# Patient Record
Sex: Female | Born: 1945 | Race: White | Hispanic: No
Health system: Southern US, Community
[De-identification: ages and names within clinical notes are randomized; demographics above are authoritative.]

## PROBLEM LIST (undated history)

## (undated) DIAGNOSIS — C9590 Leukemia, unspecified not having achieved remission: Secondary | ICD-10-CM

---

## 2005-11-29 ENCOUNTER — Encounter: Payer: Self-pay | Admitting: Internal Medicine

## 2005-12-24 ENCOUNTER — Ambulatory Visit (HOSPITAL_COMMUNITY): Admission: RE | Admit: 2005-12-24 | Discharge: 2005-12-24 | Payer: Self-pay | Admitting: Interventional Radiology

## 2007-03-31 IMAGING — CR DG CHEST 2V
2 series · 2 of 2 positions shown · non-contrast
Comparison: None

CLINICAL DATA: Cerebral artery stenosis, precatheterization. Chronic bronchitis,
COPD, heart murmur

CHEST - 2 VIEW:

[view not recorded (1 of 2)]
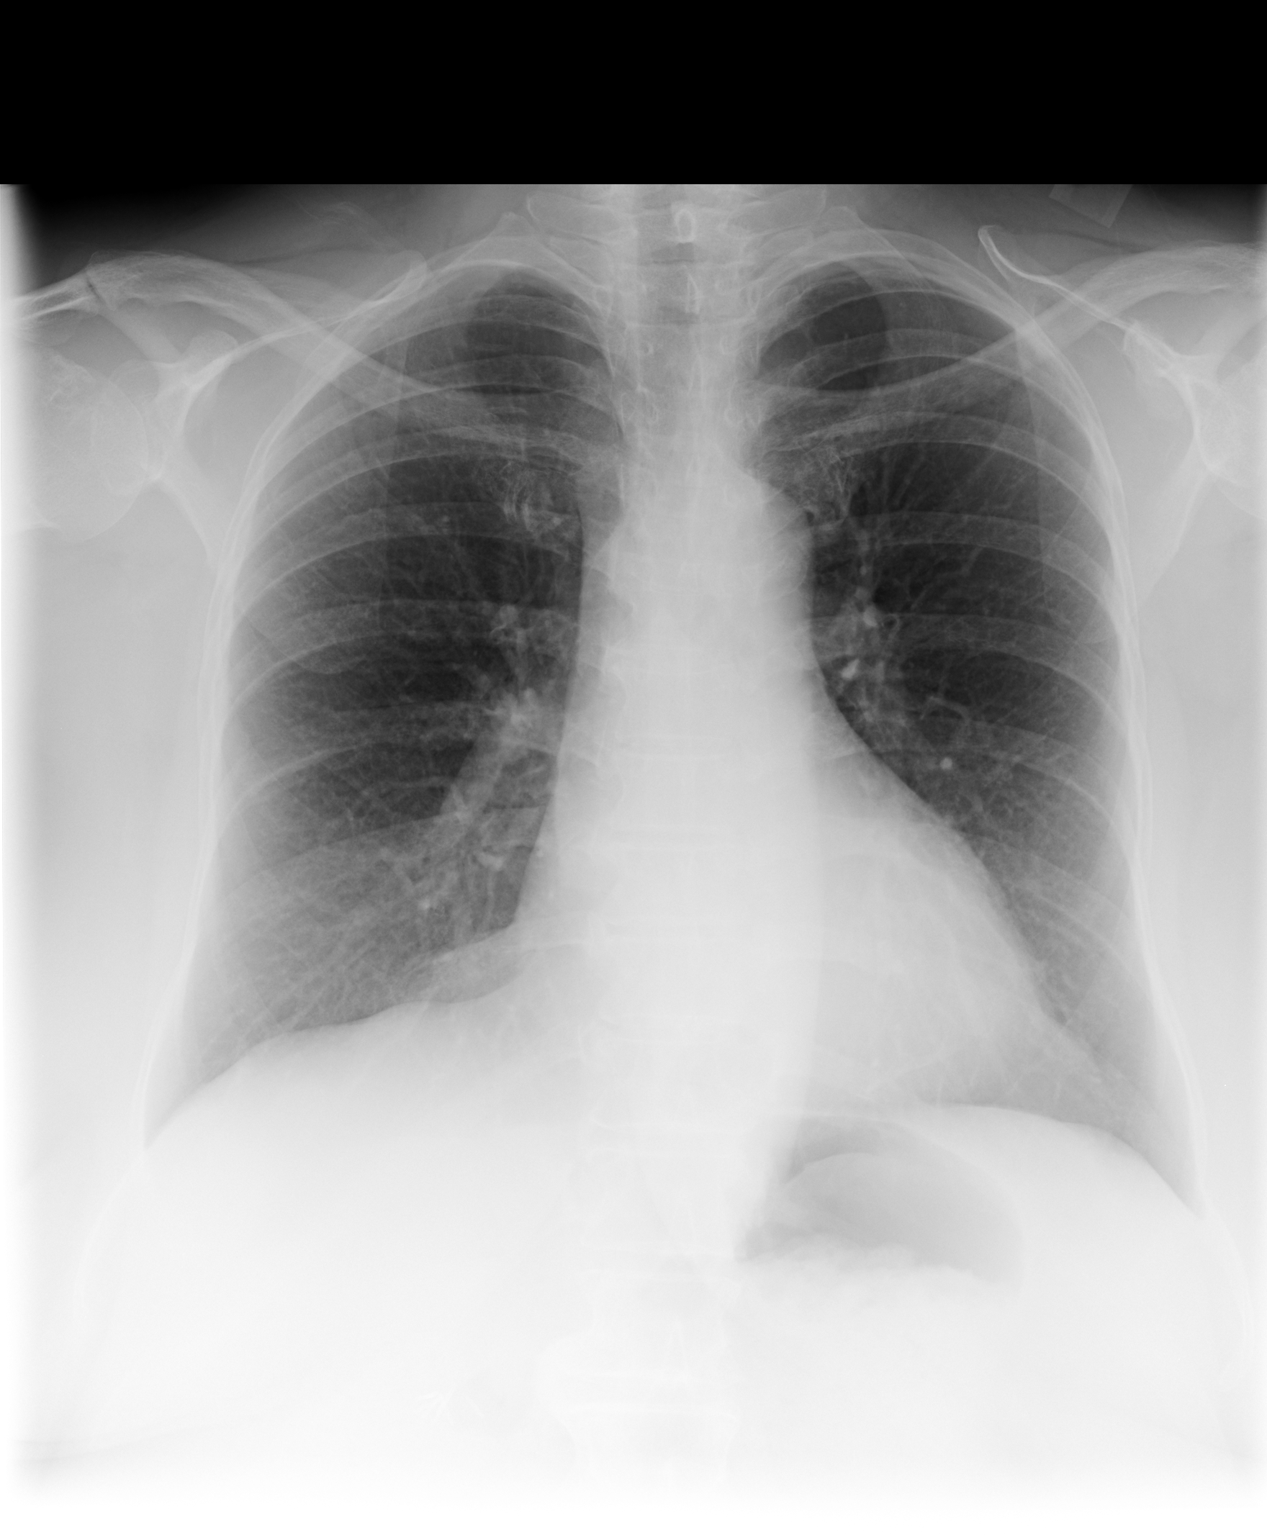

[view not recorded (2 of 2)]
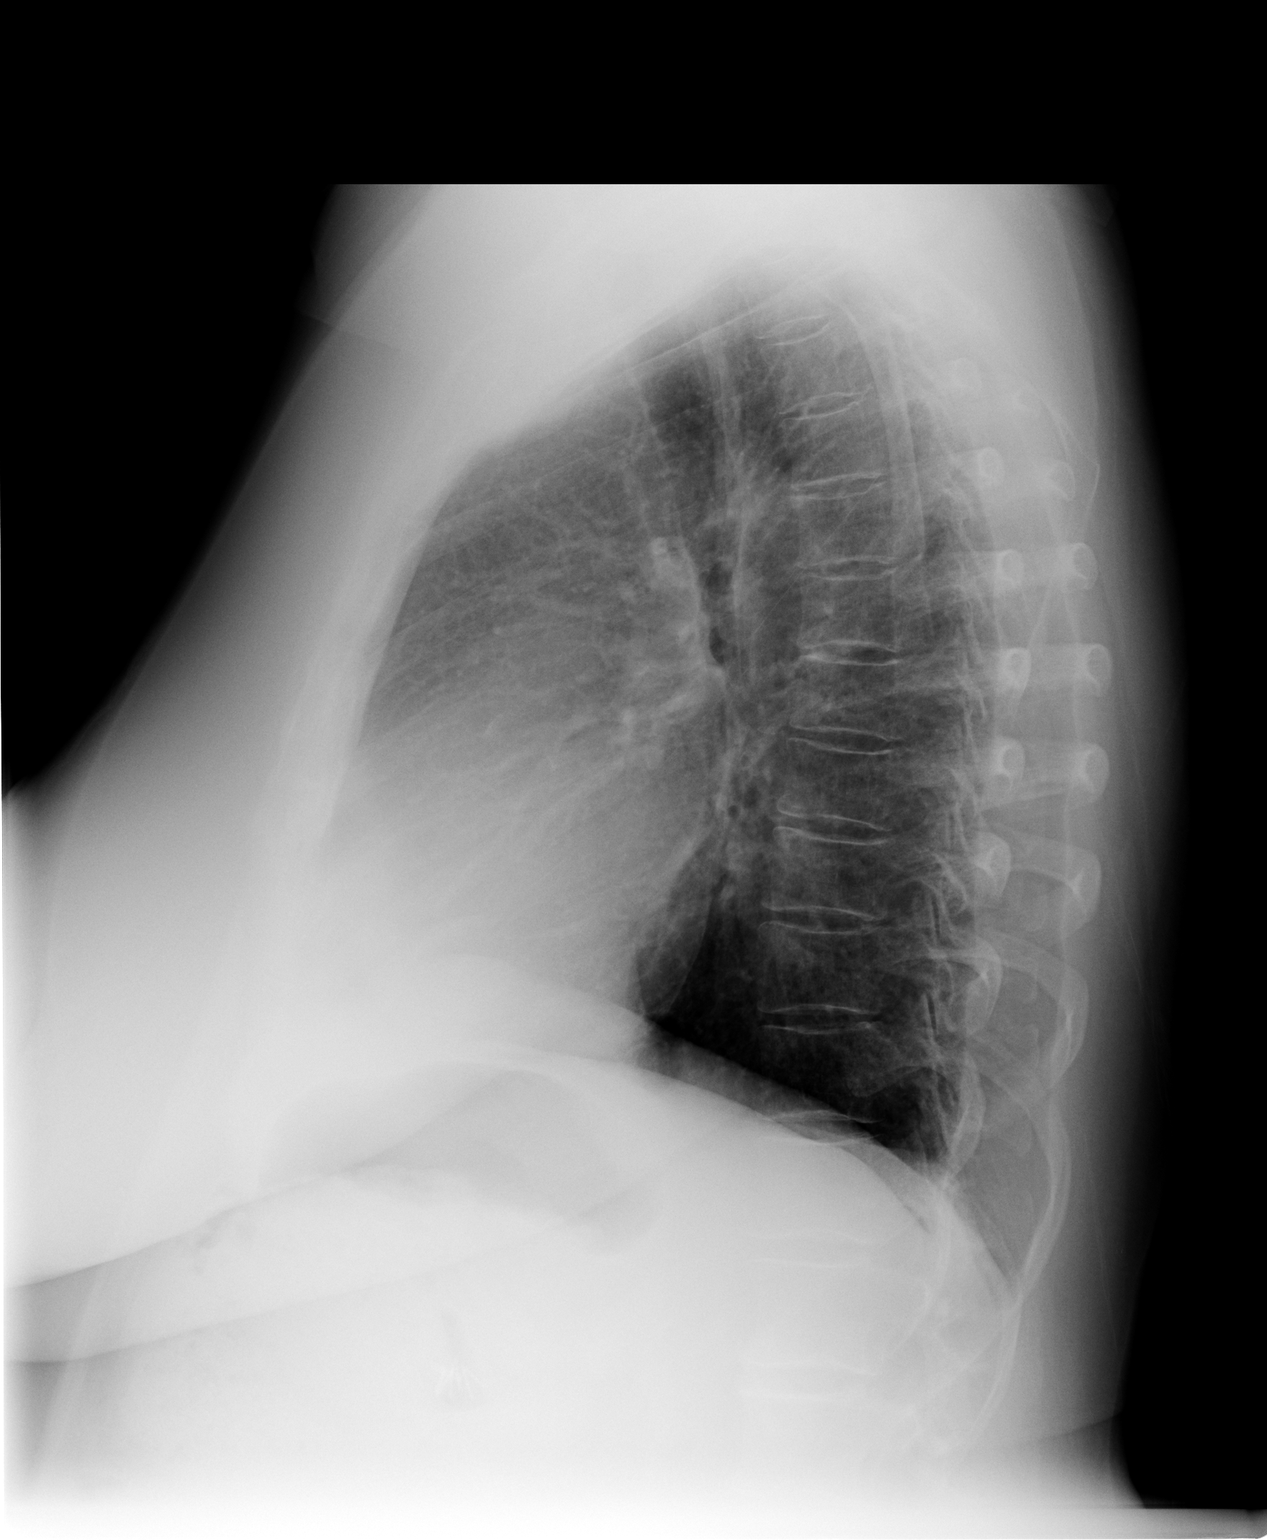

[2 of 2 positions shown; findings below may reference images not displayed]

FINDINGS: Heart is borderline in size. There is mild peribronchial thickening.
No focal opacities or effusions. Visualized skeleton unremarkable.
IMPRESSION: Borderline heart size.

Mild chronic bronchitis.

## 2011-05-03 DIAGNOSIS — E119 Type 2 diabetes mellitus without complications: Secondary | ICD-10-CM | POA: Diagnosis not present

## 2011-05-03 DIAGNOSIS — D72820 Lymphocytosis (symptomatic): Secondary | ICD-10-CM | POA: Diagnosis not present

## 2011-05-03 DIAGNOSIS — J42 Unspecified chronic bronchitis: Secondary | ICD-10-CM | POA: Diagnosis not present

## 2011-05-03 DIAGNOSIS — D801 Nonfamilial hypogammaglobulinemia: Secondary | ICD-10-CM | POA: Diagnosis not present

## 2011-05-16 DIAGNOSIS — H04559 Acquired stenosis of unspecified nasolacrimal duct: Secondary | ICD-10-CM | POA: Diagnosis not present

## 2011-05-27 DIAGNOSIS — E119 Type 2 diabetes mellitus without complications: Secondary | ICD-10-CM | POA: Diagnosis not present

## 2011-05-27 DIAGNOSIS — Z888 Allergy status to other drugs, medicaments and biological substances status: Secondary | ICD-10-CM | POA: Diagnosis not present

## 2011-05-27 DIAGNOSIS — Z882 Allergy status to sulfonamides status: Secondary | ICD-10-CM | POA: Diagnosis not present

## 2011-05-27 DIAGNOSIS — Z881 Allergy status to other antibiotic agents status: Secondary | ICD-10-CM | POA: Diagnosis not present

## 2011-05-27 DIAGNOSIS — K219 Gastro-esophageal reflux disease without esophagitis: Secondary | ICD-10-CM | POA: Diagnosis not present

## 2011-05-27 DIAGNOSIS — M5030 Other cervical disc degeneration, unspecified cervical region: Secondary | ICD-10-CM | POA: Diagnosis not present

## 2011-05-27 DIAGNOSIS — E785 Hyperlipidemia, unspecified: Secondary | ICD-10-CM | POA: Diagnosis not present

## 2011-05-27 DIAGNOSIS — D801 Nonfamilial hypogammaglobulinemia: Secondary | ICD-10-CM | POA: Diagnosis not present

## 2011-05-27 DIAGNOSIS — D72820 Lymphocytosis (symptomatic): Secondary | ICD-10-CM | POA: Diagnosis not present

## 2011-05-27 DIAGNOSIS — J069 Acute upper respiratory infection, unspecified: Secondary | ICD-10-CM | POA: Diagnosis not present

## 2011-05-27 DIAGNOSIS — Z8673 Personal history of transient ischemic attack (TIA), and cerebral infarction without residual deficits: Secondary | ICD-10-CM | POA: Diagnosis not present

## 2011-05-27 DIAGNOSIS — H04559 Acquired stenosis of unspecified nasolacrimal duct: Secondary | ICD-10-CM | POA: Diagnosis not present

## 2011-05-27 DIAGNOSIS — E041 Nontoxic single thyroid nodule: Secondary | ICD-10-CM | POA: Diagnosis not present

## 2011-05-27 DIAGNOSIS — Z79899 Other long term (current) drug therapy: Secondary | ICD-10-CM | POA: Diagnosis not present

## 2011-05-27 DIAGNOSIS — L930 Discoid lupus erythematosus: Secondary | ICD-10-CM | POA: Diagnosis not present

## 2011-06-05 DIAGNOSIS — H04309 Unspecified dacryocystitis of unspecified lacrimal passage: Secondary | ICD-10-CM | POA: Diagnosis not present

## 2011-06-17 DIAGNOSIS — Z6832 Body mass index (BMI) 32.0-32.9, adult: Secondary | ICD-10-CM | POA: Diagnosis not present

## 2011-06-17 DIAGNOSIS — J209 Acute bronchitis, unspecified: Secondary | ICD-10-CM | POA: Diagnosis not present

## 2011-06-17 DIAGNOSIS — F411 Generalized anxiety disorder: Secondary | ICD-10-CM | POA: Diagnosis not present

## 2011-06-21 DIAGNOSIS — R319 Hematuria, unspecified: Secondary | ICD-10-CM | POA: Diagnosis not present

## 2011-06-21 DIAGNOSIS — R3989 Other symptoms and signs involving the genitourinary system: Secondary | ICD-10-CM | POA: Diagnosis not present

## 2011-06-25 DIAGNOSIS — F411 Generalized anxiety disorder: Secondary | ICD-10-CM | POA: Diagnosis not present

## 2011-06-25 DIAGNOSIS — Z6831 Body mass index (BMI) 31.0-31.9, adult: Secondary | ICD-10-CM | POA: Diagnosis not present

## 2011-06-25 DIAGNOSIS — Z888 Allergy status to other drugs, medicaments and biological substances status: Secondary | ICD-10-CM | POA: Diagnosis not present

## 2011-06-25 DIAGNOSIS — R319 Hematuria, unspecified: Secondary | ICD-10-CM | POA: Diagnosis not present

## 2011-06-28 DIAGNOSIS — R319 Hematuria, unspecified: Secondary | ICD-10-CM | POA: Diagnosis not present

## 2011-06-28 DIAGNOSIS — R3989 Other symptoms and signs involving the genitourinary system: Secondary | ICD-10-CM | POA: Diagnosis not present

## 2011-06-29 DIAGNOSIS — E119 Type 2 diabetes mellitus without complications: Secondary | ICD-10-CM | POA: Diagnosis not present

## 2011-06-29 DIAGNOSIS — E7800 Pure hypercholesterolemia, unspecified: Secondary | ICD-10-CM | POA: Diagnosis not present

## 2011-06-29 DIAGNOSIS — L299 Pruritus, unspecified: Secondary | ICD-10-CM | POA: Diagnosis not present

## 2011-06-29 DIAGNOSIS — T7840XA Allergy, unspecified, initial encounter: Secondary | ICD-10-CM | POA: Diagnosis not present

## 2011-06-29 DIAGNOSIS — R21 Rash and other nonspecific skin eruption: Secondary | ICD-10-CM | POA: Diagnosis not present

## 2011-06-29 DIAGNOSIS — Z8614 Personal history of Methicillin resistant Staphylococcus aureus infection: Secondary | ICD-10-CM | POA: Diagnosis not present

## 2011-07-14 DIAGNOSIS — M549 Dorsalgia, unspecified: Secondary | ICD-10-CM | POA: Diagnosis not present

## 2011-07-14 DIAGNOSIS — R350 Frequency of micturition: Secondary | ICD-10-CM | POA: Diagnosis not present

## 2011-07-14 DIAGNOSIS — R3915 Urgency of urination: Secondary | ICD-10-CM | POA: Diagnosis not present

## 2011-07-14 DIAGNOSIS — R319 Hematuria, unspecified: Secondary | ICD-10-CM | POA: Diagnosis not present

## 2011-07-14 DIAGNOSIS — R109 Unspecified abdominal pain: Secondary | ICD-10-CM | POA: Diagnosis not present

## 2011-07-18 DIAGNOSIS — N23 Unspecified renal colic: Secondary | ICD-10-CM | POA: Diagnosis not present

## 2011-07-18 DIAGNOSIS — Z6831 Body mass index (BMI) 31.0-31.9, adult: Secondary | ICD-10-CM | POA: Diagnosis not present

## 2011-07-22 DIAGNOSIS — H179 Unspecified corneal scar and opacity: Secondary | ICD-10-CM | POA: Diagnosis not present

## 2011-07-22 DIAGNOSIS — H01029 Squamous blepharitis unspecified eye, unspecified eyelid: Secondary | ICD-10-CM | POA: Diagnosis not present

## 2011-07-26 DIAGNOSIS — D72820 Lymphocytosis (symptomatic): Secondary | ICD-10-CM | POA: Diagnosis not present

## 2011-07-26 DIAGNOSIS — D801 Nonfamilial hypogammaglobulinemia: Secondary | ICD-10-CM | POA: Diagnosis not present

## 2011-08-05 DIAGNOSIS — S8010XA Contusion of unspecified lower leg, initial encounter: Secondary | ICD-10-CM | POA: Diagnosis not present

## 2011-08-08 DIAGNOSIS — S8010XA Contusion of unspecified lower leg, initial encounter: Secondary | ICD-10-CM | POA: Diagnosis not present

## 2011-08-09 DIAGNOSIS — R296 Repeated falls: Secondary | ICD-10-CM | POA: Diagnosis not present

## 2011-08-09 DIAGNOSIS — S8010XA Contusion of unspecified lower leg, initial encounter: Secondary | ICD-10-CM | POA: Diagnosis not present

## 2011-08-09 DIAGNOSIS — S81809A Unspecified open wound, unspecified lower leg, initial encounter: Secondary | ICD-10-CM | POA: Diagnosis not present

## 2011-08-09 DIAGNOSIS — E119 Type 2 diabetes mellitus without complications: Secondary | ICD-10-CM | POA: Diagnosis not present

## 2011-08-09 DIAGNOSIS — S81009A Unspecified open wound, unspecified knee, initial encounter: Secondary | ICD-10-CM | POA: Diagnosis not present

## 2011-08-13 DIAGNOSIS — T148XXA Other injury of unspecified body region, initial encounter: Secondary | ICD-10-CM | POA: Diagnosis not present

## 2011-08-13 DIAGNOSIS — Z6830 Body mass index (BMI) 30.0-30.9, adult: Secondary | ICD-10-CM | POA: Diagnosis not present

## 2011-08-13 DIAGNOSIS — S8010XA Contusion of unspecified lower leg, initial encounter: Secondary | ICD-10-CM | POA: Diagnosis not present

## 2011-08-22 DIAGNOSIS — M79609 Pain in unspecified limb: Secondary | ICD-10-CM | POA: Diagnosis not present

## 2011-08-22 DIAGNOSIS — T148XXA Other injury of unspecified body region, initial encounter: Secondary | ICD-10-CM | POA: Diagnosis not present

## 2011-08-22 DIAGNOSIS — S82209A Unspecified fracture of shaft of unspecified tibia, initial encounter for closed fracture: Secondary | ICD-10-CM | POA: Diagnosis not present

## 2011-08-22 DIAGNOSIS — S8010XA Contusion of unspecified lower leg, initial encounter: Secondary | ICD-10-CM | POA: Diagnosis not present

## 2011-08-22 DIAGNOSIS — Z6830 Body mass index (BMI) 30.0-30.9, adult: Secondary | ICD-10-CM | POA: Diagnosis not present

## 2011-08-29 DIAGNOSIS — R109 Unspecified abdominal pain: Secondary | ICD-10-CM | POA: Diagnosis not present

## 2011-09-03 DIAGNOSIS — E119 Type 2 diabetes mellitus without complications: Secondary | ICD-10-CM | POA: Diagnosis not present

## 2011-09-03 DIAGNOSIS — R1013 Epigastric pain: Secondary | ICD-10-CM | POA: Diagnosis not present

## 2011-09-03 DIAGNOSIS — R070 Pain in throat: Secondary | ICD-10-CM | POA: Diagnosis not present

## 2011-09-03 DIAGNOSIS — Z8719 Personal history of other diseases of the digestive system: Secondary | ICD-10-CM | POA: Diagnosis not present

## 2011-09-03 DIAGNOSIS — R079 Chest pain, unspecified: Secondary | ICD-10-CM | POA: Diagnosis not present

## 2011-09-03 DIAGNOSIS — Z8673 Personal history of transient ischemic attack (TIA), and cerebral infarction without residual deficits: Secondary | ICD-10-CM | POA: Diagnosis not present

## 2011-09-04 DIAGNOSIS — R160 Hepatomegaly, not elsewhere classified: Secondary | ICD-10-CM | POA: Diagnosis not present

## 2011-09-27 DIAGNOSIS — L03039 Cellulitis of unspecified toe: Secondary | ICD-10-CM | POA: Diagnosis not present

## 2011-10-04 DIAGNOSIS — S43429A Sprain of unspecified rotator cuff capsule, initial encounter: Secondary | ICD-10-CM | POA: Diagnosis not present

## 2011-10-04 DIAGNOSIS — M25519 Pain in unspecified shoulder: Secondary | ICD-10-CM | POA: Diagnosis not present

## 2011-10-04 DIAGNOSIS — M66329 Spontaneous rupture of flexor tendons, unspecified upper arm: Secondary | ICD-10-CM | POA: Diagnosis not present

## 2011-12-17 DIAGNOSIS — T63461A Toxic effect of venom of wasps, accidental (unintentional), initial encounter: Secondary | ICD-10-CM | POA: Diagnosis not present

## 2011-12-17 DIAGNOSIS — T6391XA Toxic effect of contact with unspecified venomous animal, accidental (unintentional), initial encounter: Secondary | ICD-10-CM | POA: Diagnosis not present

## 2011-12-18 DIAGNOSIS — N644 Mastodynia: Secondary | ICD-10-CM | POA: Diagnosis not present

## 2012-02-13 DIAGNOSIS — E785 Hyperlipidemia, unspecified: Secondary | ICD-10-CM | POA: Diagnosis not present

## 2012-02-13 DIAGNOSIS — Z6830 Body mass index (BMI) 30.0-30.9, adult: Secondary | ICD-10-CM | POA: Diagnosis not present

## 2012-02-13 DIAGNOSIS — D510 Vitamin B12 deficiency anemia due to intrinsic factor deficiency: Secondary | ICD-10-CM | POA: Diagnosis not present

## 2012-02-13 DIAGNOSIS — E119 Type 2 diabetes mellitus without complications: Secondary | ICD-10-CM | POA: Diagnosis not present

## 2012-02-13 DIAGNOSIS — R252 Cramp and spasm: Secondary | ICD-10-CM | POA: Diagnosis not present

## 2012-02-13 DIAGNOSIS — J019 Acute sinusitis, unspecified: Secondary | ICD-10-CM | POA: Diagnosis not present

## 2012-03-05 DIAGNOSIS — J019 Acute sinusitis, unspecified: Secondary | ICD-10-CM | POA: Diagnosis not present

## 2012-03-05 DIAGNOSIS — J209 Acute bronchitis, unspecified: Secondary | ICD-10-CM | POA: Diagnosis not present

## 2012-03-31 DIAGNOSIS — J209 Acute bronchitis, unspecified: Secondary | ICD-10-CM | POA: Diagnosis not present

## 2012-03-31 DIAGNOSIS — Z6828 Body mass index (BMI) 28.0-28.9, adult: Secondary | ICD-10-CM | POA: Diagnosis not present

## 2012-04-25 DIAGNOSIS — J209 Acute bronchitis, unspecified: Secondary | ICD-10-CM | POA: Diagnosis not present

## 2012-04-25 DIAGNOSIS — Z6829 Body mass index (BMI) 29.0-29.9, adult: Secondary | ICD-10-CM | POA: Diagnosis not present

## 2012-05-06 DIAGNOSIS — M79609 Pain in unspecified limb: Secondary | ICD-10-CM | POA: Diagnosis not present

## 2012-05-06 DIAGNOSIS — M25579 Pain in unspecified ankle and joints of unspecified foot: Secondary | ICD-10-CM | POA: Diagnosis not present

## 2012-05-06 DIAGNOSIS — I739 Peripheral vascular disease, unspecified: Secondary | ICD-10-CM | POA: Diagnosis not present

## 2012-05-06 DIAGNOSIS — M948X9 Other specified disorders of cartilage, unspecified sites: Secondary | ICD-10-CM | POA: Diagnosis not present

## 2012-05-06 DIAGNOSIS — Z6829 Body mass index (BMI) 29.0-29.9, adult: Secondary | ICD-10-CM | POA: Diagnosis not present

## 2012-05-11 DIAGNOSIS — Z6829 Body mass index (BMI) 29.0-29.9, adult: Secondary | ICD-10-CM | POA: Diagnosis not present

## 2012-05-11 DIAGNOSIS — M25559 Pain in unspecified hip: Secondary | ICD-10-CM | POA: Diagnosis not present

## 2012-05-11 DIAGNOSIS — M545 Low back pain: Secondary | ICD-10-CM | POA: Diagnosis not present

## 2012-05-12 DIAGNOSIS — M25559 Pain in unspecified hip: Secondary | ICD-10-CM | POA: Diagnosis not present

## 2012-05-12 DIAGNOSIS — M545 Low back pain, unspecified: Secondary | ICD-10-CM | POA: Diagnosis not present

## 2012-05-12 DIAGNOSIS — I739 Peripheral vascular disease, unspecified: Secondary | ICD-10-CM | POA: Diagnosis not present

## 2012-06-02 DIAGNOSIS — M216X9 Other acquired deformities of unspecified foot: Secondary | ICD-10-CM | POA: Diagnosis not present

## 2012-06-09 DIAGNOSIS — IMO0002 Reserved for concepts with insufficient information to code with codable children: Secondary | ICD-10-CM | POA: Diagnosis not present

## 2012-06-09 DIAGNOSIS — M545 Low back pain, unspecified: Secondary | ICD-10-CM | POA: Diagnosis not present

## 2012-06-09 DIAGNOSIS — M5137 Other intervertebral disc degeneration, lumbosacral region: Secondary | ICD-10-CM | POA: Diagnosis not present

## 2012-06-09 DIAGNOSIS — R52 Pain, unspecified: Secondary | ICD-10-CM | POA: Diagnosis not present

## 2012-06-09 DIAGNOSIS — M216X9 Other acquired deformities of unspecified foot: Secondary | ICD-10-CM | POA: Diagnosis not present

## 2012-06-11 DIAGNOSIS — M5126 Other intervertebral disc displacement, lumbar region: Secondary | ICD-10-CM | POA: Diagnosis not present

## 2012-06-11 DIAGNOSIS — IMO0002 Reserved for concepts with insufficient information to code with codable children: Secondary | ICD-10-CM | POA: Diagnosis not present

## 2012-06-11 DIAGNOSIS — E119 Type 2 diabetes mellitus without complications: Secondary | ICD-10-CM | POA: Diagnosis not present

## 2012-06-11 DIAGNOSIS — Z8719 Personal history of other diseases of the digestive system: Secondary | ICD-10-CM | POA: Diagnosis not present

## 2012-06-11 DIAGNOSIS — M4800 Spinal stenosis, site unspecified: Secondary | ICD-10-CM | POA: Diagnosis not present

## 2012-06-11 DIAGNOSIS — Z794 Long term (current) use of insulin: Secondary | ICD-10-CM | POA: Diagnosis not present

## 2012-06-11 DIAGNOSIS — R5381 Other malaise: Secondary | ICD-10-CM | POA: Diagnosis not present

## 2012-06-11 DIAGNOSIS — R5383 Other fatigue: Secondary | ICD-10-CM | POA: Diagnosis not present

## 2012-06-16 DIAGNOSIS — M76899 Other specified enthesopathies of unspecified lower limb, excluding foot: Secondary | ICD-10-CM | POA: Diagnosis not present

## 2012-06-23 DIAGNOSIS — R29898 Other symptoms and signs involving the musculoskeletal system: Secondary | ICD-10-CM | POA: Diagnosis not present

## 2012-06-24 DIAGNOSIS — Z6828 Body mass index (BMI) 28.0-28.9, adult: Secondary | ICD-10-CM | POA: Diagnosis not present

## 2012-06-24 DIAGNOSIS — F0781 Postconcussional syndrome: Secondary | ICD-10-CM | POA: Diagnosis not present

## 2012-06-24 DIAGNOSIS — M5030 Other cervical disc degeneration, unspecified cervical region: Secondary | ICD-10-CM | POA: Diagnosis not present

## 2012-06-24 DIAGNOSIS — S060X9A Concussion with loss of consciousness of unspecified duration, initial encounter: Secondary | ICD-10-CM | POA: Diagnosis not present

## 2012-06-29 DIAGNOSIS — M47812 Spondylosis without myelopathy or radiculopathy, cervical region: Secondary | ICD-10-CM | POA: Diagnosis not present

## 2012-06-29 DIAGNOSIS — M5030 Other cervical disc degeneration, unspecified cervical region: Secondary | ICD-10-CM | POA: Diagnosis not present

## 2012-06-29 DIAGNOSIS — S060X9A Concussion with loss of consciousness of unspecified duration, initial encounter: Secondary | ICD-10-CM | POA: Diagnosis not present

## 2012-06-30 DIAGNOSIS — R29898 Other symptoms and signs involving the musculoskeletal system: Secondary | ICD-10-CM | POA: Diagnosis not present

## 2012-07-04 DIAGNOSIS — M169 Osteoarthritis of hip, unspecified: Secondary | ICD-10-CM | POA: Diagnosis not present

## 2012-07-04 DIAGNOSIS — M76899 Other specified enthesopathies of unspecified lower limb, excluding foot: Secondary | ICD-10-CM | POA: Diagnosis not present

## 2012-07-04 DIAGNOSIS — M948X9 Other specified disorders of cartilage, unspecified sites: Secondary | ICD-10-CM | POA: Diagnosis not present

## 2012-07-04 DIAGNOSIS — M5137 Other intervertebral disc degeneration, lumbosacral region: Secondary | ICD-10-CM | POA: Diagnosis not present

## 2012-07-04 DIAGNOSIS — S838X9A Sprain of other specified parts of unspecified knee, initial encounter: Secondary | ICD-10-CM | POA: Diagnosis not present

## 2012-07-04 DIAGNOSIS — R29898 Other symptoms and signs involving the musculoskeletal system: Secondary | ICD-10-CM | POA: Diagnosis not present

## 2012-07-07 DIAGNOSIS — Z87828 Personal history of other (healed) physical injury and trauma: Secondary | ICD-10-CM | POA: Diagnosis not present

## 2012-07-07 DIAGNOSIS — T148XXA Other injury of unspecified body region, initial encounter: Secondary | ICD-10-CM | POA: Diagnosis not present

## 2012-07-07 DIAGNOSIS — R29898 Other symptoms and signs involving the musculoskeletal system: Secondary | ICD-10-CM | POA: Diagnosis not present

## 2012-07-29 DIAGNOSIS — N390 Urinary tract infection, site not specified: Secondary | ICD-10-CM | POA: Diagnosis not present

## 2012-07-29 DIAGNOSIS — Z6828 Body mass index (BMI) 28.0-28.9, adult: Secondary | ICD-10-CM | POA: Diagnosis not present

## 2012-08-11 DIAGNOSIS — M79609 Pain in unspecified limb: Secondary | ICD-10-CM | POA: Diagnosis not present

## 2012-08-11 DIAGNOSIS — M216X9 Other acquired deformities of unspecified foot: Secondary | ICD-10-CM | POA: Diagnosis not present

## 2012-09-02 DIAGNOSIS — A048 Other specified bacterial intestinal infections: Secondary | ICD-10-CM | POA: Diagnosis not present

## 2012-09-21 DIAGNOSIS — R339 Retention of urine, unspecified: Secondary | ICD-10-CM | POA: Diagnosis not present

## 2012-10-12 DIAGNOSIS — R918 Other nonspecific abnormal finding of lung field: Secondary | ICD-10-CM | POA: Diagnosis not present

## 2012-10-12 DIAGNOSIS — R911 Solitary pulmonary nodule: Secondary | ICD-10-CM | POA: Diagnosis not present

## 2012-11-16 DIAGNOSIS — Z6828 Body mass index (BMI) 28.0-28.9, adult: Secondary | ICD-10-CM | POA: Diagnosis not present

## 2012-11-16 DIAGNOSIS — J209 Acute bronchitis, unspecified: Secondary | ICD-10-CM | POA: Diagnosis not present

## 2012-12-09 DIAGNOSIS — M216X9 Other acquired deformities of unspecified foot: Secondary | ICD-10-CM | POA: Diagnosis not present

## 2012-12-24 DIAGNOSIS — M216X9 Other acquired deformities of unspecified foot: Secondary | ICD-10-CM | POA: Diagnosis not present

## 2012-12-24 DIAGNOSIS — M76899 Other specified enthesopathies of unspecified lower limb, excluding foot: Secondary | ICD-10-CM | POA: Diagnosis not present

## 2012-12-24 DIAGNOSIS — R29898 Other symptoms and signs involving the musculoskeletal system: Secondary | ICD-10-CM | POA: Diagnosis not present

## 2013-01-01 DIAGNOSIS — E041 Nontoxic single thyroid nodule: Secondary | ICD-10-CM | POA: Diagnosis not present

## 2013-02-03 DIAGNOSIS — M5380 Other specified dorsopathies, site unspecified: Secondary | ICD-10-CM | POA: Diagnosis not present

## 2013-02-03 DIAGNOSIS — M545 Low back pain, unspecified: Secondary | ICD-10-CM | POA: Diagnosis not present

## 2013-02-03 DIAGNOSIS — M216X9 Other acquired deformities of unspecified foot: Secondary | ICD-10-CM | POA: Diagnosis not present

## 2013-02-16 DIAGNOSIS — Z6828 Body mass index (BMI) 28.0-28.9, adult: Secondary | ICD-10-CM | POA: Diagnosis not present

## 2013-02-16 DIAGNOSIS — J209 Acute bronchitis, unspecified: Secondary | ICD-10-CM | POA: Diagnosis not present

## 2013-02-22 DIAGNOSIS — J209 Acute bronchitis, unspecified: Secondary | ICD-10-CM | POA: Diagnosis not present

## 2013-02-22 DIAGNOSIS — J40 Bronchitis, not specified as acute or chronic: Secondary | ICD-10-CM | POA: Diagnosis not present

## 2013-03-08 DIAGNOSIS — T7840XA Allergy, unspecified, initial encounter: Secondary | ICD-10-CM | POA: Diagnosis not present

## 2013-03-08 DIAGNOSIS — T50991A Poisoning by other drugs, medicaments and biological substances, accidental (unintentional), initial encounter: Secondary | ICD-10-CM | POA: Diagnosis not present

## 2013-03-08 DIAGNOSIS — R209 Unspecified disturbances of skin sensation: Secondary | ICD-10-CM | POA: Diagnosis not present

## 2013-03-08 DIAGNOSIS — R404 Transient alteration of awareness: Secondary | ICD-10-CM | POA: Diagnosis not present

## 2013-03-08 DIAGNOSIS — R5381 Other malaise: Secondary | ICD-10-CM | POA: Diagnosis not present

## 2013-03-08 DIAGNOSIS — E871 Hypo-osmolality and hyponatremia: Secondary | ICD-10-CM | POA: Diagnosis not present

## 2013-03-10 DIAGNOSIS — R490 Dysphonia: Secondary | ICD-10-CM | POA: Diagnosis not present

## 2013-03-15 DIAGNOSIS — Z87442 Personal history of urinary calculi: Secondary | ICD-10-CM | POA: Diagnosis not present

## 2013-03-15 DIAGNOSIS — D801 Nonfamilial hypogammaglobulinemia: Secondary | ICD-10-CM | POA: Diagnosis not present

## 2013-03-15 DIAGNOSIS — Z8679 Personal history of other diseases of the circulatory system: Secondary | ICD-10-CM | POA: Diagnosis not present

## 2013-03-15 DIAGNOSIS — K219 Gastro-esophageal reflux disease without esophagitis: Secondary | ICD-10-CM | POA: Diagnosis not present

## 2013-03-15 DIAGNOSIS — E119 Type 2 diabetes mellitus without complications: Secondary | ICD-10-CM | POA: Diagnosis not present

## 2013-03-15 DIAGNOSIS — K449 Diaphragmatic hernia without obstruction or gangrene: Secondary | ICD-10-CM | POA: Diagnosis not present

## 2013-03-15 DIAGNOSIS — K7460 Unspecified cirrhosis of liver: Secondary | ICD-10-CM | POA: Diagnosis not present

## 2013-03-15 DIAGNOSIS — E785 Hyperlipidemia, unspecified: Secondary | ICD-10-CM | POA: Diagnosis not present

## 2013-03-15 DIAGNOSIS — Z8673 Personal history of transient ischemic attack (TIA), and cerebral infarction without residual deficits: Secondary | ICD-10-CM | POA: Diagnosis not present

## 2013-03-15 DIAGNOSIS — D72820 Lymphocytosis (symptomatic): Secondary | ICD-10-CM | POA: Diagnosis not present

## 2013-03-15 DIAGNOSIS — M5030 Other cervical disc degeneration, unspecified cervical region: Secondary | ICD-10-CM | POA: Diagnosis not present

## 2013-03-15 DIAGNOSIS — M329 Systemic lupus erythematosus, unspecified: Secondary | ICD-10-CM | POA: Diagnosis not present

## 2013-03-25 DIAGNOSIS — E119 Type 2 diabetes mellitus without complications: Secondary | ICD-10-CM | POA: Diagnosis not present

## 2013-03-25 DIAGNOSIS — K7689 Other specified diseases of liver: Secondary | ICD-10-CM | POA: Diagnosis not present

## 2013-03-25 DIAGNOSIS — J209 Acute bronchitis, unspecified: Secondary | ICD-10-CM | POA: Diagnosis not present

## 2013-03-25 DIAGNOSIS — Z6828 Body mass index (BMI) 28.0-28.9, adult: Secondary | ICD-10-CM | POA: Diagnosis not present

## 2013-03-25 DIAGNOSIS — E785 Hyperlipidemia, unspecified: Secondary | ICD-10-CM | POA: Diagnosis not present

## 2013-05-13 DIAGNOSIS — H259 Unspecified age-related cataract: Secondary | ICD-10-CM | POA: Diagnosis not present

## 2013-05-13 DIAGNOSIS — H179 Unspecified corneal scar and opacity: Secondary | ICD-10-CM | POA: Diagnosis not present

## 2013-05-13 DIAGNOSIS — H524 Presbyopia: Secondary | ICD-10-CM | POA: Diagnosis not present

## 2013-05-13 DIAGNOSIS — H52209 Unspecified astigmatism, unspecified eye: Secondary | ICD-10-CM | POA: Diagnosis not present

## 2013-05-13 DIAGNOSIS — H5210 Myopia, unspecified eye: Secondary | ICD-10-CM | POA: Diagnosis not present

## 2013-05-27 DIAGNOSIS — N390 Urinary tract infection, site not specified: Secondary | ICD-10-CM | POA: Diagnosis not present

## 2013-07-12 DIAGNOSIS — H179 Unspecified corneal scar and opacity: Secondary | ICD-10-CM | POA: Diagnosis not present

## 2013-07-12 DIAGNOSIS — H01009 Unspecified blepharitis unspecified eye, unspecified eyelid: Secondary | ICD-10-CM | POA: Diagnosis not present

## 2013-07-12 DIAGNOSIS — H2510 Age-related nuclear cataract, unspecified eye: Secondary | ICD-10-CM | POA: Diagnosis not present

## 2013-07-12 DIAGNOSIS — H1130 Conjunctival hemorrhage, unspecified eye: Secondary | ICD-10-CM | POA: Diagnosis not present

## 2013-08-19 DIAGNOSIS — M545 Low back pain, unspecified: Secondary | ICD-10-CM | POA: Diagnosis not present

## 2013-08-27 DIAGNOSIS — Z6830 Body mass index (BMI) 30.0-30.9, adult: Secondary | ICD-10-CM | POA: Diagnosis not present

## 2013-08-27 DIAGNOSIS — M5137 Other intervertebral disc degeneration, lumbosacral region: Secondary | ICD-10-CM | POA: Diagnosis not present

## 2013-08-27 DIAGNOSIS — M66329 Spontaneous rupture of flexor tendons, unspecified upper arm: Secondary | ICD-10-CM | POA: Diagnosis not present

## 2013-08-31 DIAGNOSIS — M66329 Spontaneous rupture of flexor tendons, unspecified upper arm: Secondary | ICD-10-CM | POA: Diagnosis not present

## 2013-08-31 DIAGNOSIS — M5137 Other intervertebral disc degeneration, lumbosacral region: Secondary | ICD-10-CM | POA: Diagnosis not present

## 2013-09-01 DIAGNOSIS — M5137 Other intervertebral disc degeneration, lumbosacral region: Secondary | ICD-10-CM | POA: Diagnosis not present

## 2013-09-01 DIAGNOSIS — M66329 Spontaneous rupture of flexor tendons, unspecified upper arm: Secondary | ICD-10-CM | POA: Diagnosis not present

## 2013-09-02 DIAGNOSIS — M5137 Other intervertebral disc degeneration, lumbosacral region: Secondary | ICD-10-CM | POA: Diagnosis not present

## 2013-09-02 DIAGNOSIS — M66329 Spontaneous rupture of flexor tendons, unspecified upper arm: Secondary | ICD-10-CM | POA: Diagnosis not present

## 2013-09-03 DIAGNOSIS — M5137 Other intervertebral disc degeneration, lumbosacral region: Secondary | ICD-10-CM | POA: Diagnosis not present

## 2013-09-03 DIAGNOSIS — M5126 Other intervertebral disc displacement, lumbar region: Secondary | ICD-10-CM | POA: Diagnosis not present

## 2013-09-03 DIAGNOSIS — M545 Low back pain, unspecified: Secondary | ICD-10-CM | POA: Diagnosis not present

## 2013-09-06 DIAGNOSIS — M5137 Other intervertebral disc degeneration, lumbosacral region: Secondary | ICD-10-CM | POA: Diagnosis not present

## 2013-09-06 DIAGNOSIS — M66329 Spontaneous rupture of flexor tendons, unspecified upper arm: Secondary | ICD-10-CM | POA: Diagnosis not present

## 2013-09-08 DIAGNOSIS — M66329 Spontaneous rupture of flexor tendons, unspecified upper arm: Secondary | ICD-10-CM | POA: Diagnosis not present

## 2013-09-08 DIAGNOSIS — M5137 Other intervertebral disc degeneration, lumbosacral region: Secondary | ICD-10-CM | POA: Diagnosis not present

## 2013-09-09 DIAGNOSIS — M66329 Spontaneous rupture of flexor tendons, unspecified upper arm: Secondary | ICD-10-CM | POA: Diagnosis not present

## 2013-09-09 DIAGNOSIS — M5137 Other intervertebral disc degeneration, lumbosacral region: Secondary | ICD-10-CM | POA: Diagnosis not present

## 2013-09-14 DIAGNOSIS — M5137 Other intervertebral disc degeneration, lumbosacral region: Secondary | ICD-10-CM | POA: Diagnosis not present

## 2013-09-14 DIAGNOSIS — M66329 Spontaneous rupture of flexor tendons, unspecified upper arm: Secondary | ICD-10-CM | POA: Diagnosis not present

## 2013-09-15 DIAGNOSIS — M5137 Other intervertebral disc degeneration, lumbosacral region: Secondary | ICD-10-CM | POA: Diagnosis not present

## 2013-09-15 DIAGNOSIS — M66329 Spontaneous rupture of flexor tendons, unspecified upper arm: Secondary | ICD-10-CM | POA: Diagnosis not present

## 2013-09-16 DIAGNOSIS — M66329 Spontaneous rupture of flexor tendons, unspecified upper arm: Secondary | ICD-10-CM | POA: Diagnosis not present

## 2013-09-16 DIAGNOSIS — M5137 Other intervertebral disc degeneration, lumbosacral region: Secondary | ICD-10-CM | POA: Diagnosis not present

## 2013-09-21 DIAGNOSIS — J209 Acute bronchitis, unspecified: Secondary | ICD-10-CM | POA: Diagnosis not present

## 2013-09-21 DIAGNOSIS — Z6830 Body mass index (BMI) 30.0-30.9, adult: Secondary | ICD-10-CM | POA: Diagnosis not present

## 2013-09-22 DIAGNOSIS — M545 Low back pain, unspecified: Secondary | ICD-10-CM | POA: Diagnosis not present

## 2013-09-22 DIAGNOSIS — IMO0002 Reserved for concepts with insufficient information to code with codable children: Secondary | ICD-10-CM | POA: Diagnosis not present

## 2013-09-27 DIAGNOSIS — M66329 Spontaneous rupture of flexor tendons, unspecified upper arm: Secondary | ICD-10-CM | POA: Diagnosis not present

## 2013-09-27 DIAGNOSIS — M5137 Other intervertebral disc degeneration, lumbosacral region: Secondary | ICD-10-CM | POA: Diagnosis not present

## 2013-09-28 DIAGNOSIS — M5137 Other intervertebral disc degeneration, lumbosacral region: Secondary | ICD-10-CM | POA: Diagnosis not present

## 2013-09-28 DIAGNOSIS — M66329 Spontaneous rupture of flexor tendons, unspecified upper arm: Secondary | ICD-10-CM | POA: Diagnosis not present

## 2013-09-30 DIAGNOSIS — M66329 Spontaneous rupture of flexor tendons, unspecified upper arm: Secondary | ICD-10-CM | POA: Diagnosis not present

## 2013-09-30 DIAGNOSIS — M5137 Other intervertebral disc degeneration, lumbosacral region: Secondary | ICD-10-CM | POA: Diagnosis not present

## 2013-10-05 DIAGNOSIS — M75100 Unspecified rotator cuff tear or rupture of unspecified shoulder, not specified as traumatic: Secondary | ICD-10-CM | POA: Diagnosis not present

## 2013-10-05 DIAGNOSIS — M19019 Primary osteoarthritis, unspecified shoulder: Secondary | ICD-10-CM | POA: Diagnosis not present

## 2013-10-05 DIAGNOSIS — IMO0002 Reserved for concepts with insufficient information to code with codable children: Secondary | ICD-10-CM | POA: Diagnosis not present

## 2013-10-05 DIAGNOSIS — M7520 Bicipital tendinitis, unspecified shoulder: Secondary | ICD-10-CM | POA: Diagnosis not present

## 2013-10-05 DIAGNOSIS — M899 Disorder of bone, unspecified: Secondary | ICD-10-CM | POA: Diagnosis not present

## 2013-10-05 DIAGNOSIS — S43429A Sprain of unspecified rotator cuff capsule, initial encounter: Secondary | ICD-10-CM | POA: Diagnosis not present

## 2013-10-14 DIAGNOSIS — S43429A Sprain of unspecified rotator cuff capsule, initial encounter: Secondary | ICD-10-CM | POA: Diagnosis not present

## 2013-10-14 DIAGNOSIS — M75100 Unspecified rotator cuff tear or rupture of unspecified shoulder, not specified as traumatic: Secondary | ICD-10-CM | POA: Diagnosis not present

## 2013-10-14 DIAGNOSIS — M7520 Bicipital tendinitis, unspecified shoulder: Secondary | ICD-10-CM | POA: Diagnosis not present

## 2013-10-14 DIAGNOSIS — M19019 Primary osteoarthritis, unspecified shoulder: Secondary | ICD-10-CM | POA: Diagnosis not present

## 2013-10-14 DIAGNOSIS — IMO0002 Reserved for concepts with insufficient information to code with codable children: Secondary | ICD-10-CM | POA: Diagnosis not present

## 2013-10-19 DIAGNOSIS — S43499A Other sprain of unspecified shoulder joint, initial encounter: Secondary | ICD-10-CM | POA: Diagnosis not present

## 2013-10-19 DIAGNOSIS — IMO0002 Reserved for concepts with insufficient information to code with codable children: Secondary | ICD-10-CM | POA: Diagnosis not present

## 2013-10-19 DIAGNOSIS — M19019 Primary osteoarthritis, unspecified shoulder: Secondary | ICD-10-CM | POA: Diagnosis not present

## 2013-10-19 DIAGNOSIS — M67919 Unspecified disorder of synovium and tendon, unspecified shoulder: Secondary | ICD-10-CM | POA: Diagnosis not present

## 2013-10-19 DIAGNOSIS — S43429A Sprain of unspecified rotator cuff capsule, initial encounter: Secondary | ICD-10-CM | POA: Diagnosis not present

## 2013-10-19 DIAGNOSIS — M75100 Unspecified rotator cuff tear or rupture of unspecified shoulder, not specified as traumatic: Secondary | ICD-10-CM | POA: Diagnosis not present

## 2013-10-19 DIAGNOSIS — S46819A Strain of other muscles, fascia and tendons at shoulder and upper arm level, unspecified arm, initial encounter: Secondary | ICD-10-CM | POA: Diagnosis not present

## 2013-10-25 DIAGNOSIS — M5030 Other cervical disc degeneration, unspecified cervical region: Secondary | ICD-10-CM | POA: Diagnosis not present

## 2013-10-25 DIAGNOSIS — D72820 Lymphocytosis (symptomatic): Secondary | ICD-10-CM | POA: Diagnosis not present

## 2013-10-25 DIAGNOSIS — R894 Abnormal immunological findings in specimens from other organs, systems and tissues: Secondary | ICD-10-CM | POA: Diagnosis not present

## 2013-10-25 DIAGNOSIS — D801 Nonfamilial hypogammaglobulinemia: Secondary | ICD-10-CM | POA: Diagnosis not present

## 2013-10-25 DIAGNOSIS — B999 Unspecified infectious disease: Secondary | ICD-10-CM | POA: Diagnosis not present

## 2013-10-25 DIAGNOSIS — C9190 Lymphoid leukemia, unspecified not having achieved remission: Secondary | ICD-10-CM | POA: Diagnosis not present

## 2013-10-25 DIAGNOSIS — Z8673 Personal history of transient ischemic attack (TIA), and cerebral infarction without residual deficits: Secondary | ICD-10-CM | POA: Diagnosis not present

## 2013-10-25 DIAGNOSIS — Z87442 Personal history of urinary calculi: Secondary | ICD-10-CM | POA: Diagnosis not present

## 2013-10-25 DIAGNOSIS — E785 Hyperlipidemia, unspecified: Secondary | ICD-10-CM | POA: Diagnosis not present

## 2013-10-25 DIAGNOSIS — Z8679 Personal history of other diseases of the circulatory system: Secondary | ICD-10-CM | POA: Diagnosis not present

## 2013-10-25 DIAGNOSIS — E041 Nontoxic single thyroid nodule: Secondary | ICD-10-CM | POA: Diagnosis not present

## 2013-10-25 DIAGNOSIS — C9110 Chronic lymphocytic leukemia of B-cell type not having achieved remission: Secondary | ICD-10-CM | POA: Diagnosis not present

## 2013-11-02 DIAGNOSIS — Z6828 Body mass index (BMI) 28.0-28.9, adult: Secondary | ICD-10-CM | POA: Diagnosis not present

## 2013-11-02 DIAGNOSIS — J209 Acute bronchitis, unspecified: Secondary | ICD-10-CM | POA: Diagnosis not present

## 2014-01-28 DIAGNOSIS — E785 Hyperlipidemia, unspecified: Secondary | ICD-10-CM | POA: Diagnosis not present

## 2014-01-28 DIAGNOSIS — C9110 Chronic lymphocytic leukemia of B-cell type not having achieved remission: Secondary | ICD-10-CM | POA: Diagnosis not present

## 2014-01-28 DIAGNOSIS — J209 Acute bronchitis, unspecified: Secondary | ICD-10-CM | POA: Diagnosis not present

## 2014-01-28 DIAGNOSIS — H10023 Other mucopurulent conjunctivitis, bilateral: Secondary | ICD-10-CM | POA: Diagnosis not present

## 2014-01-28 DIAGNOSIS — E041 Nontoxic single thyroid nodule: Secondary | ICD-10-CM | POA: Diagnosis not present

## 2014-01-28 DIAGNOSIS — Z6822 Body mass index (BMI) 22.0-22.9, adult: Secondary | ICD-10-CM | POA: Diagnosis not present

## 2014-01-28 DIAGNOSIS — Z6826 Body mass index (BMI) 26.0-26.9, adult: Secondary | ICD-10-CM | POA: Diagnosis not present

## 2014-01-28 DIAGNOSIS — E119 Type 2 diabetes mellitus without complications: Secondary | ICD-10-CM | POA: Diagnosis not present

## 2014-01-28 DIAGNOSIS — K7460 Unspecified cirrhosis of liver: Secondary | ICD-10-CM | POA: Diagnosis not present

## 2014-04-13 DIAGNOSIS — Z9181 History of falling: Secondary | ICD-10-CM | POA: Diagnosis not present

## 2014-04-13 DIAGNOSIS — Z6827 Body mass index (BMI) 27.0-27.9, adult: Secondary | ICD-10-CM | POA: Diagnosis not present

## 2014-04-13 DIAGNOSIS — K2970 Gastritis, unspecified, without bleeding: Secondary | ICD-10-CM | POA: Diagnosis not present

## 2014-04-13 DIAGNOSIS — Z1389 Encounter for screening for other disorder: Secondary | ICD-10-CM | POA: Diagnosis not present

## 2014-05-11 DIAGNOSIS — M25511 Pain in right shoulder: Secondary | ICD-10-CM | POA: Diagnosis not present

## 2014-05-11 DIAGNOSIS — J209 Acute bronchitis, unspecified: Secondary | ICD-10-CM | POA: Diagnosis not present

## 2014-05-11 DIAGNOSIS — Z6827 Body mass index (BMI) 27.0-27.9, adult: Secondary | ICD-10-CM | POA: Diagnosis not present

## 2014-05-18 DIAGNOSIS — M25561 Pain in right knee: Secondary | ICD-10-CM | POA: Diagnosis not present

## 2014-05-18 DIAGNOSIS — S83281A Other tear of lateral meniscus, current injury, right knee, initial encounter: Secondary | ICD-10-CM | POA: Diagnosis not present

## 2014-05-23 DIAGNOSIS — C8590 Non-Hodgkin lymphoma, unspecified, unspecified site: Secondary | ICD-10-CM | POA: Diagnosis not present

## 2014-05-23 DIAGNOSIS — C9190 Lymphoid leukemia, unspecified not having achieved remission: Secondary | ICD-10-CM | POA: Diagnosis not present

## 2014-05-25 DIAGNOSIS — Z6826 Body mass index (BMI) 26.0-26.9, adult: Secondary | ICD-10-CM | POA: Diagnosis not present

## 2014-05-25 DIAGNOSIS — R0789 Other chest pain: Secondary | ICD-10-CM | POA: Diagnosis not present

## 2014-05-25 DIAGNOSIS — R0989 Other specified symptoms and signs involving the circulatory and respiratory systems: Secondary | ICD-10-CM | POA: Diagnosis not present

## 2014-05-25 DIAGNOSIS — R05 Cough: Secondary | ICD-10-CM | POA: Diagnosis not present

## 2014-05-31 DIAGNOSIS — R05 Cough: Secondary | ICD-10-CM | POA: Diagnosis not present

## 2014-05-31 DIAGNOSIS — R0989 Other specified symptoms and signs involving the circulatory and respiratory systems: Secondary | ICD-10-CM | POA: Diagnosis not present

## 2014-05-31 DIAGNOSIS — K449 Diaphragmatic hernia without obstruction or gangrene: Secondary | ICD-10-CM | POA: Diagnosis not present

## 2014-06-01 DIAGNOSIS — M25551 Pain in right hip: Secondary | ICD-10-CM | POA: Diagnosis not present

## 2014-06-01 DIAGNOSIS — Z6827 Body mass index (BMI) 27.0-27.9, adult: Secondary | ICD-10-CM | POA: Diagnosis not present

## 2014-08-08 DIAGNOSIS — Z6827 Body mass index (BMI) 27.0-27.9, adult: Secondary | ICD-10-CM | POA: Diagnosis not present

## 2014-08-08 DIAGNOSIS — K2970 Gastritis, unspecified, without bleeding: Secondary | ICD-10-CM | POA: Diagnosis not present

## 2014-08-09 DIAGNOSIS — K2970 Gastritis, unspecified, without bleeding: Secondary | ICD-10-CM | POA: Diagnosis not present

## 2014-09-21 DIAGNOSIS — M545 Low back pain: Secondary | ICD-10-CM | POA: Diagnosis not present

## 2014-09-21 DIAGNOSIS — M5416 Radiculopathy, lumbar region: Secondary | ICD-10-CM | POA: Diagnosis not present

## 2014-11-30 DIAGNOSIS — M545 Low back pain: Secondary | ICD-10-CM | POA: Diagnosis not present

## 2014-11-30 DIAGNOSIS — Z981 Arthrodesis status: Secondary | ICD-10-CM | POA: Diagnosis not present

## 2014-11-30 DIAGNOSIS — M6281 Muscle weakness (generalized): Secondary | ICD-10-CM | POA: Diagnosis not present

## 2014-11-30 DIAGNOSIS — E119 Type 2 diabetes mellitus without complications: Secondary | ICD-10-CM | POA: Diagnosis not present

## 2014-11-30 DIAGNOSIS — M4726 Other spondylosis with radiculopathy, lumbar region: Secondary | ICD-10-CM | POA: Diagnosis not present

## 2014-11-30 DIAGNOSIS — R531 Weakness: Secondary | ICD-10-CM | POA: Diagnosis not present

## 2014-11-30 DIAGNOSIS — M5416 Radiculopathy, lumbar region: Secondary | ICD-10-CM | POA: Diagnosis not present

## 2014-11-30 DIAGNOSIS — M71551 Other bursitis, not elsewhere classified, right hip: Secondary | ICD-10-CM | POA: Diagnosis not present

## 2014-11-30 DIAGNOSIS — Z8673 Personal history of transient ischemic attack (TIA), and cerebral infarction without residual deficits: Secondary | ICD-10-CM | POA: Diagnosis not present

## 2014-11-30 DIAGNOSIS — M5137 Other intervertebral disc degeneration, lumbosacral region: Secondary | ICD-10-CM | POA: Diagnosis not present

## 2014-11-30 DIAGNOSIS — M7061 Trochanteric bursitis, right hip: Secondary | ICD-10-CM | POA: Diagnosis not present

## 2014-12-15 DIAGNOSIS — J208 Acute bronchitis due to other specified organisms: Secondary | ICD-10-CM | POA: Diagnosis not present

## 2014-12-15 DIAGNOSIS — Z6826 Body mass index (BMI) 26.0-26.9, adult: Secondary | ICD-10-CM | POA: Diagnosis not present

## 2014-12-15 DIAGNOSIS — H1013 Acute atopic conjunctivitis, bilateral: Secondary | ICD-10-CM | POA: Diagnosis not present

## 2015-01-25 DIAGNOSIS — C9590 Leukemia, unspecified not having achieved remission: Secondary | ICD-10-CM | POA: Diagnosis not present

## 2015-01-25 DIAGNOSIS — Z86718 Personal history of other venous thrombosis and embolism: Secondary | ICD-10-CM | POA: Diagnosis not present

## 2015-01-25 DIAGNOSIS — J42 Unspecified chronic bronchitis: Secondary | ICD-10-CM | POA: Diagnosis not present

## 2015-01-25 DIAGNOSIS — R490 Dysphonia: Secondary | ICD-10-CM | POA: Diagnosis not present

## 2015-01-25 DIAGNOSIS — R1310 Dysphagia, unspecified: Secondary | ICD-10-CM | POA: Diagnosis not present

## 2015-01-25 DIAGNOSIS — J384 Edema of larynx: Secondary | ICD-10-CM | POA: Diagnosis not present

## 2015-01-25 DIAGNOSIS — E041 Nontoxic single thyroid nodule: Secondary | ICD-10-CM | POA: Diagnosis not present

## 2015-01-30 DIAGNOSIS — Z6827 Body mass index (BMI) 27.0-27.9, adult: Secondary | ICD-10-CM | POA: Diagnosis not present

## 2015-01-30 DIAGNOSIS — J208 Acute bronchitis due to other specified organisms: Secondary | ICD-10-CM | POA: Diagnosis not present

## 2015-01-30 DIAGNOSIS — J019 Acute sinusitis, unspecified: Secondary | ICD-10-CM | POA: Diagnosis not present

## 2015-02-09 DIAGNOSIS — H2513 Age-related nuclear cataract, bilateral: Secondary | ICD-10-CM | POA: Diagnosis not present

## 2015-02-09 DIAGNOSIS — K219 Gastro-esophageal reflux disease without esophagitis: Secondary | ICD-10-CM | POA: Diagnosis not present

## 2015-02-09 DIAGNOSIS — E041 Nontoxic single thyroid nodule: Secondary | ICD-10-CM | POA: Diagnosis not present

## 2015-02-09 DIAGNOSIS — L930 Discoid lupus erythematosus: Secondary | ICD-10-CM | POA: Diagnosis not present

## 2015-02-09 DIAGNOSIS — E785 Hyperlipidemia, unspecified: Secondary | ICD-10-CM | POA: Diagnosis not present

## 2015-02-09 DIAGNOSIS — H1820 Unspecified corneal edema: Secondary | ICD-10-CM | POA: Diagnosis not present

## 2015-02-09 DIAGNOSIS — S0502XA Injury of conjunctiva and corneal abrasion without foreign body, left eye, initial encounter: Secondary | ICD-10-CM | POA: Diagnosis not present

## 2015-02-09 DIAGNOSIS — H179 Unspecified corneal scar and opacity: Secondary | ICD-10-CM | POA: Diagnosis not present

## 2015-02-09 DIAGNOSIS — E119 Type 2 diabetes mellitus without complications: Secondary | ICD-10-CM | POA: Diagnosis not present

## 2015-03-20 DIAGNOSIS — Z9181 History of falling: Secondary | ICD-10-CM | POA: Diagnosis not present

## 2015-03-20 DIAGNOSIS — B373 Candidiasis of vulva and vagina: Secondary | ICD-10-CM | POA: Diagnosis not present

## 2015-03-20 DIAGNOSIS — Z1389 Encounter for screening for other disorder: Secondary | ICD-10-CM | POA: Diagnosis not present

## 2015-03-20 DIAGNOSIS — Z6827 Body mass index (BMI) 27.0-27.9, adult: Secondary | ICD-10-CM | POA: Diagnosis not present

## 2015-03-20 DIAGNOSIS — N390 Urinary tract infection, site not specified: Secondary | ICD-10-CM | POA: Diagnosis not present

## 2015-04-05 DIAGNOSIS — R509 Fever, unspecified: Secondary | ICD-10-CM | POA: Diagnosis not present

## 2015-05-11 DIAGNOSIS — Z6826 Body mass index (BMI) 26.0-26.9, adult: Secondary | ICD-10-CM | POA: Diagnosis not present

## 2015-05-11 DIAGNOSIS — J208 Acute bronchitis due to other specified organisms: Secondary | ICD-10-CM | POA: Diagnosis not present

## 2015-07-19 DIAGNOSIS — K581 Irritable bowel syndrome with constipation: Secondary | ICD-10-CM | POA: Diagnosis not present

## 2015-07-19 DIAGNOSIS — R21 Rash and other nonspecific skin eruption: Secondary | ICD-10-CM | POA: Diagnosis not present

## 2015-07-19 DIAGNOSIS — Z1231 Encounter for screening mammogram for malignant neoplasm of breast: Secondary | ICD-10-CM | POA: Diagnosis not present

## 2015-07-19 DIAGNOSIS — Z6826 Body mass index (BMI) 26.0-26.9, adult: Secondary | ICD-10-CM | POA: Diagnosis not present

## 2015-07-25 DIAGNOSIS — Z1231 Encounter for screening mammogram for malignant neoplasm of breast: Secondary | ICD-10-CM | POA: Diagnosis not present

## 2015-09-19 DIAGNOSIS — Z6826 Body mass index (BMI) 26.0-26.9, adult: Secondary | ICD-10-CM | POA: Diagnosis not present

## 2015-09-19 DIAGNOSIS — R21 Rash and other nonspecific skin eruption: Secondary | ICD-10-CM | POA: Diagnosis not present

## 2015-09-22 DIAGNOSIS — Z6827 Body mass index (BMI) 27.0-27.9, adult: Secondary | ICD-10-CM | POA: Diagnosis not present

## 2015-09-22 DIAGNOSIS — E663 Overweight: Secondary | ICD-10-CM | POA: Diagnosis not present

## 2015-09-22 DIAGNOSIS — N390 Urinary tract infection, site not specified: Secondary | ICD-10-CM | POA: Diagnosis not present

## 2015-10-17 DIAGNOSIS — R1031 Right lower quadrant pain: Secondary | ICD-10-CM | POA: Diagnosis not present

## 2015-10-17 DIAGNOSIS — R300 Dysuria: Secondary | ICD-10-CM | POA: Diagnosis not present

## 2015-10-17 DIAGNOSIS — R109 Unspecified abdominal pain: Secondary | ICD-10-CM | POA: Diagnosis not present

## 2015-12-27 DIAGNOSIS — R21 Rash and other nonspecific skin eruption: Secondary | ICD-10-CM | POA: Diagnosis not present

## 2016-02-08 DIAGNOSIS — Z6826 Body mass index (BMI) 26.0-26.9, adult: Secondary | ICD-10-CM | POA: Diagnosis not present

## 2016-02-08 DIAGNOSIS — S86912A Strain of unspecified muscle(s) and tendon(s) at lower leg level, left leg, initial encounter: Secondary | ICD-10-CM | POA: Diagnosis not present

## 2016-02-12 DIAGNOSIS — M1712 Unilateral primary osteoarthritis, left knee: Secondary | ICD-10-CM | POA: Diagnosis not present

## 2016-02-12 DIAGNOSIS — S86812A Strain of other muscle(s) and tendon(s) at lower leg level, left leg, initial encounter: Secondary | ICD-10-CM | POA: Diagnosis not present

## 2016-02-12 DIAGNOSIS — S86912A Strain of unspecified muscle(s) and tendon(s) at lower leg level, left leg, initial encounter: Secondary | ICD-10-CM | POA: Diagnosis not present

## 2016-05-25 DIAGNOSIS — R300 Dysuria: Secondary | ICD-10-CM | POA: Diagnosis not present

## 2016-05-25 DIAGNOSIS — N760 Acute vaginitis: Secondary | ICD-10-CM | POA: Diagnosis not present

## 2016-05-28 DIAGNOSIS — R32 Unspecified urinary incontinence: Secondary | ICD-10-CM | POA: Diagnosis not present

## 2016-05-28 DIAGNOSIS — B373 Candidiasis of vulva and vagina: Secondary | ICD-10-CM | POA: Diagnosis not present

## 2016-05-31 DIAGNOSIS — J208 Acute bronchitis due to other specified organisms: Secondary | ICD-10-CM | POA: Diagnosis not present

## 2016-05-31 DIAGNOSIS — R6889 Other general symptoms and signs: Secondary | ICD-10-CM | POA: Diagnosis not present

## 2016-05-31 DIAGNOSIS — Z6827 Body mass index (BMI) 27.0-27.9, adult: Secondary | ICD-10-CM | POA: Diagnosis not present

## 2016-05-31 DIAGNOSIS — R509 Fever, unspecified: Secondary | ICD-10-CM | POA: Diagnosis not present

## 2016-06-03 DIAGNOSIS — J101 Influenza due to other identified influenza virus with other respiratory manifestations: Secondary | ICD-10-CM | POA: Diagnosis not present

## 2016-06-03 DIAGNOSIS — R05 Cough: Secondary | ICD-10-CM | POA: Diagnosis not present

## 2016-07-01 DIAGNOSIS — B373 Candidiasis of vulva and vagina: Secondary | ICD-10-CM | POA: Diagnosis not present

## 2016-07-01 DIAGNOSIS — E119 Type 2 diabetes mellitus without complications: Secondary | ICD-10-CM | POA: Diagnosis not present

## 2016-07-01 DIAGNOSIS — Z1231 Encounter for screening mammogram for malignant neoplasm of breast: Secondary | ICD-10-CM | POA: Diagnosis not present

## 2016-07-01 DIAGNOSIS — E785 Hyperlipidemia, unspecified: Secondary | ICD-10-CM | POA: Diagnosis not present

## 2016-07-01 DIAGNOSIS — H1031 Unspecified acute conjunctivitis, right eye: Secondary | ICD-10-CM | POA: Diagnosis not present

## 2016-09-04 DIAGNOSIS — Z882 Allergy status to sulfonamides status: Secondary | ICD-10-CM | POA: Diagnosis not present

## 2016-09-04 DIAGNOSIS — Z881 Allergy status to other antibiotic agents status: Secondary | ICD-10-CM | POA: Diagnosis not present

## 2016-09-04 DIAGNOSIS — H01006 Unspecified blepharitis left eye, unspecified eyelid: Secondary | ICD-10-CM | POA: Diagnosis not present

## 2016-09-04 DIAGNOSIS — H25812 Combined forms of age-related cataract, left eye: Secondary | ICD-10-CM | POA: Diagnosis not present

## 2016-09-04 DIAGNOSIS — Z8673 Personal history of transient ischemic attack (TIA), and cerebral infarction without residual deficits: Secondary | ICD-10-CM | POA: Diagnosis not present

## 2016-09-04 DIAGNOSIS — E785 Hyperlipidemia, unspecified: Secondary | ICD-10-CM | POA: Diagnosis not present

## 2016-09-04 DIAGNOSIS — Z888 Allergy status to other drugs, medicaments and biological substances status: Secondary | ICD-10-CM | POA: Diagnosis not present

## 2016-09-04 DIAGNOSIS — H53143 Visual discomfort, bilateral: Secondary | ICD-10-CM | POA: Diagnosis not present

## 2016-09-04 DIAGNOSIS — H01003 Unspecified blepharitis right eye, unspecified eyelid: Secondary | ICD-10-CM | POA: Diagnosis not present

## 2016-09-04 DIAGNOSIS — H179 Unspecified corneal scar and opacity: Secondary | ICD-10-CM | POA: Diagnosis not present

## 2016-09-04 DIAGNOSIS — E1136 Type 2 diabetes mellitus with diabetic cataract: Secondary | ICD-10-CM | POA: Diagnosis not present

## 2016-12-05 DIAGNOSIS — Z9181 History of falling: Secondary | ICD-10-CM | POA: Diagnosis not present

## 2016-12-05 DIAGNOSIS — E119 Type 2 diabetes mellitus without complications: Secondary | ICD-10-CM | POA: Diagnosis not present

## 2016-12-05 DIAGNOSIS — Z0000 Encounter for general adult medical examination without abnormal findings: Secondary | ICD-10-CM | POA: Diagnosis not present

## 2016-12-05 DIAGNOSIS — Z1389 Encounter for screening for other disorder: Secondary | ICD-10-CM | POA: Diagnosis not present

## 2016-12-05 DIAGNOSIS — E785 Hyperlipidemia, unspecified: Secondary | ICD-10-CM | POA: Diagnosis not present

## 2016-12-05 DIAGNOSIS — N959 Unspecified menopausal and perimenopausal disorder: Secondary | ICD-10-CM | POA: Diagnosis not present

## 2016-12-05 DIAGNOSIS — C9190 Lymphoid leukemia, unspecified not having achieved remission: Secondary | ICD-10-CM | POA: Diagnosis not present

## 2016-12-05 DIAGNOSIS — Z136 Encounter for screening for cardiovascular disorders: Secondary | ICD-10-CM | POA: Diagnosis not present

## 2016-12-05 DIAGNOSIS — Z1231 Encounter for screening mammogram for malignant neoplasm of breast: Secondary | ICD-10-CM | POA: Diagnosis not present

## 2017-01-09 DIAGNOSIS — M5030 Other cervical disc degeneration, unspecified cervical region: Secondary | ICD-10-CM | POA: Diagnosis not present

## 2017-01-09 DIAGNOSIS — K449 Diaphragmatic hernia without obstruction or gangrene: Secondary | ICD-10-CM | POA: Diagnosis not present

## 2017-01-09 DIAGNOSIS — B999 Unspecified infectious disease: Secondary | ICD-10-CM | POA: Diagnosis not present

## 2017-01-09 DIAGNOSIS — E119 Type 2 diabetes mellitus without complications: Secondary | ICD-10-CM | POA: Diagnosis not present

## 2017-01-09 DIAGNOSIS — E785 Hyperlipidemia, unspecified: Secondary | ICD-10-CM | POA: Diagnosis not present

## 2017-01-09 DIAGNOSIS — Z87442 Personal history of urinary calculi: Secondary | ICD-10-CM | POA: Diagnosis not present

## 2017-01-09 DIAGNOSIS — K7460 Unspecified cirrhosis of liver: Secondary | ICD-10-CM | POA: Diagnosis not present

## 2017-01-09 DIAGNOSIS — C9190 Lymphoid leukemia, unspecified not having achieved remission: Secondary | ICD-10-CM | POA: Diagnosis not present

## 2017-01-09 DIAGNOSIS — K5900 Constipation, unspecified: Secondary | ICD-10-CM | POA: Diagnosis not present

## 2017-01-09 DIAGNOSIS — Z8673 Personal history of transient ischemic attack (TIA), and cerebral infarction without residual deficits: Secondary | ICD-10-CM | POA: Diagnosis not present

## 2017-01-09 DIAGNOSIS — Z882 Allergy status to sulfonamides status: Secondary | ICD-10-CM | POA: Diagnosis not present

## 2017-01-09 DIAGNOSIS — E041 Nontoxic single thyroid nodule: Secondary | ICD-10-CM | POA: Diagnosis not present

## 2017-01-09 DIAGNOSIS — C8590 Non-Hodgkin lymphoma, unspecified, unspecified site: Secondary | ICD-10-CM | POA: Diagnosis not present

## 2017-01-09 DIAGNOSIS — Z87448 Personal history of other diseases of urinary system: Secondary | ICD-10-CM | POA: Diagnosis not present

## 2017-01-09 DIAGNOSIS — L930 Discoid lupus erythematosus: Secondary | ICD-10-CM | POA: Diagnosis not present

## 2017-01-09 DIAGNOSIS — K219 Gastro-esophageal reflux disease without esophagitis: Secondary | ICD-10-CM | POA: Diagnosis not present

## 2017-01-09 DIAGNOSIS — H5319 Other subjective visual disturbances: Secondary | ICD-10-CM | POA: Diagnosis not present

## 2017-01-28 DIAGNOSIS — R161 Splenomegaly, not elsewhere classified: Secondary | ICD-10-CM | POA: Diagnosis not present

## 2017-01-28 DIAGNOSIS — I700 Atherosclerosis of aorta: Secondary | ICD-10-CM | POA: Diagnosis not present

## 2017-01-28 DIAGNOSIS — C9110 Chronic lymphocytic leukemia of B-cell type not having achieved remission: Secondary | ICD-10-CM | POA: Diagnosis not present

## 2017-01-28 DIAGNOSIS — K7689 Other specified diseases of liver: Secondary | ICD-10-CM | POA: Diagnosis not present

## 2017-01-28 DIAGNOSIS — H5319 Other subjective visual disturbances: Secondary | ICD-10-CM | POA: Diagnosis not present

## 2017-01-28 DIAGNOSIS — R51 Headache: Secondary | ICD-10-CM | POA: Diagnosis not present

## 2017-02-20 DIAGNOSIS — H1789 Other corneal scars and opacities: Secondary | ICD-10-CM | POA: Diagnosis not present

## 2017-02-20 DIAGNOSIS — H25812 Combined forms of age-related cataract, left eye: Secondary | ICD-10-CM | POA: Diagnosis not present

## 2017-02-20 DIAGNOSIS — H53143 Visual discomfort, bilateral: Secondary | ICD-10-CM | POA: Diagnosis not present

## 2017-04-23 DIAGNOSIS — E041 Nontoxic single thyroid nodule: Secondary | ICD-10-CM | POA: Diagnosis not present

## 2017-04-28 DIAGNOSIS — E041 Nontoxic single thyroid nodule: Secondary | ICD-10-CM | POA: Diagnosis not present

## 2017-04-28 DIAGNOSIS — E042 Nontoxic multinodular goiter: Secondary | ICD-10-CM | POA: Diagnosis not present

## 2017-05-30 DIAGNOSIS — S80861A Insect bite (nonvenomous), right lower leg, initial encounter: Secondary | ICD-10-CM | POA: Diagnosis not present

## 2017-07-03 DIAGNOSIS — J0100 Acute maxillary sinusitis, unspecified: Secondary | ICD-10-CM | POA: Diagnosis not present

## 2017-09-04 DIAGNOSIS — N760 Acute vaginitis: Secondary | ICD-10-CM | POA: Diagnosis not present

## 2017-09-04 DIAGNOSIS — Z6827 Body mass index (BMI) 27.0-27.9, adult: Secondary | ICD-10-CM | POA: Diagnosis not present

## 2017-09-22 DIAGNOSIS — N23 Unspecified renal colic: Secondary | ICD-10-CM | POA: Diagnosis not present

## 2017-09-22 DIAGNOSIS — N281 Cyst of kidney, acquired: Secondary | ICD-10-CM | POA: Diagnosis not present

## 2017-09-22 DIAGNOSIS — R350 Frequency of micturition: Secondary | ICD-10-CM | POA: Diagnosis not present

## 2017-09-23 DIAGNOSIS — E119 Type 2 diabetes mellitus without complications: Secondary | ICD-10-CM | POA: Diagnosis not present

## 2017-09-25 DIAGNOSIS — Z8673 Personal history of transient ischemic attack (TIA), and cerebral infarction without residual deficits: Secondary | ICD-10-CM | POA: Diagnosis not present

## 2017-09-25 DIAGNOSIS — F411 Generalized anxiety disorder: Secondary | ICD-10-CM | POA: Diagnosis not present

## 2017-09-25 DIAGNOSIS — Z79899 Other long term (current) drug therapy: Secondary | ICD-10-CM | POA: Diagnosis not present

## 2017-09-25 DIAGNOSIS — J449 Chronic obstructive pulmonary disease, unspecified: Secondary | ICD-10-CM | POA: Diagnosis not present

## 2017-09-25 DIAGNOSIS — T373X5A Adverse effect of other antiprotozoal drugs, initial encounter: Secondary | ICD-10-CM | POA: Diagnosis not present

## 2017-09-30 DIAGNOSIS — N281 Cyst of kidney, acquired: Secondary | ICD-10-CM | POA: Diagnosis not present

## 2017-09-30 DIAGNOSIS — R161 Splenomegaly, not elsewhere classified: Secondary | ICD-10-CM | POA: Diagnosis not present

## 2017-10-10 DIAGNOSIS — Z8673 Personal history of transient ischemic attack (TIA), and cerebral infarction without residual deficits: Secondary | ICD-10-CM | POA: Diagnosis not present

## 2017-10-10 DIAGNOSIS — S81811A Laceration without foreign body, right lower leg, initial encounter: Secondary | ICD-10-CM | POA: Diagnosis not present

## 2017-10-10 DIAGNOSIS — Z23 Encounter for immunization: Secondary | ICD-10-CM | POA: Diagnosis not present

## 2017-10-10 DIAGNOSIS — S81811D Laceration without foreign body, right lower leg, subsequent encounter: Secondary | ICD-10-CM | POA: Diagnosis not present

## 2017-10-10 DIAGNOSIS — Z4800 Encounter for change or removal of nonsurgical wound dressing: Secondary | ICD-10-CM | POA: Diagnosis not present

## 2017-10-10 DIAGNOSIS — Z79899 Other long term (current) drug therapy: Secondary | ICD-10-CM | POA: Diagnosis not present

## 2018-01-01 DIAGNOSIS — Z1231 Encounter for screening mammogram for malignant neoplasm of breast: Secondary | ICD-10-CM | POA: Diagnosis not present

## 2018-01-28 DIAGNOSIS — Z1331 Encounter for screening for depression: Secondary | ICD-10-CM | POA: Diagnosis not present

## 2018-01-28 DIAGNOSIS — Z9181 History of falling: Secondary | ICD-10-CM | POA: Diagnosis not present

## 2018-01-28 DIAGNOSIS — Z0000 Encounter for general adult medical examination without abnormal findings: Secondary | ICD-10-CM | POA: Diagnosis not present

## 2018-01-28 DIAGNOSIS — Z136 Encounter for screening for cardiovascular disorders: Secondary | ICD-10-CM | POA: Diagnosis not present

## 2018-01-28 DIAGNOSIS — N959 Unspecified menopausal and perimenopausal disorder: Secondary | ICD-10-CM | POA: Diagnosis not present

## 2018-01-28 DIAGNOSIS — E785 Hyperlipidemia, unspecified: Secondary | ICD-10-CM | POA: Diagnosis not present

## 2018-02-04 DIAGNOSIS — M810 Age-related osteoporosis without current pathological fracture: Secondary | ICD-10-CM | POA: Diagnosis not present

## 2018-02-04 DIAGNOSIS — M85852 Other specified disorders of bone density and structure, left thigh: Secondary | ICD-10-CM | POA: Diagnosis not present

## 2018-02-04 DIAGNOSIS — E2839 Other primary ovarian failure: Secondary | ICD-10-CM | POA: Diagnosis not present

## 2018-02-19 DIAGNOSIS — D819 Combined immunodeficiency, unspecified: Secondary | ICD-10-CM | POA: Diagnosis not present

## 2018-02-19 DIAGNOSIS — Z0001 Encounter for general adult medical examination with abnormal findings: Secondary | ICD-10-CM | POA: Diagnosis not present

## 2018-02-19 DIAGNOSIS — E538 Deficiency of other specified B group vitamins: Secondary | ICD-10-CM | POA: Diagnosis not present

## 2018-02-19 DIAGNOSIS — M810 Age-related osteoporosis without current pathological fracture: Secondary | ICD-10-CM | POA: Diagnosis not present

## 2018-02-19 DIAGNOSIS — R161 Splenomegaly, not elsewhere classified: Secondary | ICD-10-CM | POA: Diagnosis not present

## 2018-02-19 DIAGNOSIS — D696 Thrombocytopenia, unspecified: Secondary | ICD-10-CM | POA: Diagnosis not present

## 2018-02-19 DIAGNOSIS — K7460 Unspecified cirrhosis of liver: Secondary | ICD-10-CM

## 2018-02-19 DIAGNOSIS — D732 Chronic congestive splenomegaly: Secondary | ICD-10-CM | POA: Diagnosis not present

## 2018-02-19 DIAGNOSIS — C9110 Chronic lymphocytic leukemia of B-cell type not having achieved remission: Secondary | ICD-10-CM | POA: Diagnosis not present

## 2018-03-10 ENCOUNTER — Other Ambulatory Visit: Payer: Self-pay

## 2018-04-10 DIAGNOSIS — L989 Disorder of the skin and subcutaneous tissue, unspecified: Secondary | ICD-10-CM | POA: Diagnosis not present

## 2018-04-25 DIAGNOSIS — Z20828 Contact with and (suspected) exposure to other viral communicable diseases: Secondary | ICD-10-CM | POA: Diagnosis not present

## 2018-04-28 DIAGNOSIS — E119 Type 2 diabetes mellitus without complications: Secondary | ICD-10-CM | POA: Diagnosis not present

## 2018-04-28 DIAGNOSIS — R6889 Other general symptoms and signs: Secondary | ICD-10-CM | POA: Diagnosis not present

## 2018-04-28 DIAGNOSIS — J208 Acute bronchitis due to other specified organisms: Secondary | ICD-10-CM | POA: Diagnosis not present

## 2018-07-28 DIAGNOSIS — E119 Type 2 diabetes mellitus without complications: Secondary | ICD-10-CM | POA: Diagnosis not present

## 2018-07-28 DIAGNOSIS — Z0000 Encounter for general adult medical examination without abnormal findings: Secondary | ICD-10-CM | POA: Diagnosis not present

## 2018-07-28 DIAGNOSIS — J209 Acute bronchitis, unspecified: Secondary | ICD-10-CM | POA: Diagnosis not present

## 2018-07-28 DIAGNOSIS — Z6827 Body mass index (BMI) 27.0-27.9, adult: Secondary | ICD-10-CM | POA: Diagnosis not present

## 2018-07-28 DIAGNOSIS — R82998 Other abnormal findings in urine: Secondary | ICD-10-CM | POA: Diagnosis not present

## 2018-07-28 DIAGNOSIS — H10021 Other mucopurulent conjunctivitis, right eye: Secondary | ICD-10-CM | POA: Diagnosis not present

## 2018-08-14 DIAGNOSIS — N390 Urinary tract infection, site not specified: Secondary | ICD-10-CM | POA: Diagnosis not present

## 2018-08-14 DIAGNOSIS — Z139 Encounter for screening, unspecified: Secondary | ICD-10-CM | POA: Diagnosis not present

## 2018-08-14 DIAGNOSIS — R3129 Other microscopic hematuria: Secondary | ICD-10-CM | POA: Diagnosis not present

## 2018-08-14 DIAGNOSIS — E663 Overweight: Secondary | ICD-10-CM | POA: Diagnosis not present

## 2018-09-11 DIAGNOSIS — E119 Type 2 diabetes mellitus without complications: Secondary | ICD-10-CM | POA: Diagnosis not present

## 2018-09-11 DIAGNOSIS — R319 Hematuria, unspecified: Secondary | ICD-10-CM | POA: Diagnosis not present

## 2018-09-11 DIAGNOSIS — B0052 Herpesviral keratitis: Secondary | ICD-10-CM | POA: Diagnosis not present

## 2018-09-15 DIAGNOSIS — Z79899 Other long term (current) drug therapy: Secondary | ICD-10-CM | POA: Diagnosis not present

## 2018-09-15 DIAGNOSIS — N952 Postmenopausal atrophic vaginitis: Secondary | ICD-10-CM | POA: Diagnosis not present

## 2018-09-15 DIAGNOSIS — N3946 Mixed incontinence: Secondary | ICD-10-CM | POA: Diagnosis not present

## 2018-09-15 DIAGNOSIS — R3129 Other microscopic hematuria: Secondary | ICD-10-CM | POA: Diagnosis not present

## 2018-09-15 DIAGNOSIS — C9110 Chronic lymphocytic leukemia of B-cell type not having achieved remission: Secondary | ICD-10-CM | POA: Diagnosis not present

## 2018-09-16 DIAGNOSIS — B0052 Herpesviral keratitis: Secondary | ICD-10-CM | POA: Diagnosis not present

## 2018-09-21 DIAGNOSIS — H179 Unspecified corneal scar and opacity: Secondary | ICD-10-CM | POA: Diagnosis not present

## 2018-09-21 DIAGNOSIS — H25812 Combined forms of age-related cataract, left eye: Secondary | ICD-10-CM | POA: Diagnosis not present

## 2018-09-21 DIAGNOSIS — B0052 Herpesviral keratitis: Secondary | ICD-10-CM | POA: Diagnosis not present

## 2018-09-21 DIAGNOSIS — H2512 Age-related nuclear cataract, left eye: Secondary | ICD-10-CM | POA: Diagnosis not present

## 2018-09-24 DIAGNOSIS — B0052 Herpesviral keratitis: Secondary | ICD-10-CM | POA: Diagnosis not present

## 2018-09-24 DIAGNOSIS — H2512 Age-related nuclear cataract, left eye: Secondary | ICD-10-CM | POA: Diagnosis not present

## 2018-09-24 DIAGNOSIS — Z79899 Other long term (current) drug therapy: Secondary | ICD-10-CM | POA: Diagnosis not present

## 2018-09-24 DIAGNOSIS — H179 Unspecified corneal scar and opacity: Secondary | ICD-10-CM | POA: Diagnosis not present

## 2018-10-05 DIAGNOSIS — H179 Unspecified corneal scar and opacity: Secondary | ICD-10-CM | POA: Diagnosis not present

## 2018-10-05 DIAGNOSIS — H25812 Combined forms of age-related cataract, left eye: Secondary | ICD-10-CM | POA: Diagnosis not present

## 2018-10-05 DIAGNOSIS — B0052 Herpesviral keratitis: Secondary | ICD-10-CM | POA: Diagnosis not present

## 2018-10-05 DIAGNOSIS — H2512 Age-related nuclear cataract, left eye: Secondary | ICD-10-CM | POA: Diagnosis not present

## 2018-10-29 DIAGNOSIS — Z7952 Long term (current) use of systemic steroids: Secondary | ICD-10-CM | POA: Diagnosis not present

## 2018-10-29 DIAGNOSIS — H179 Unspecified corneal scar and opacity: Secondary | ICD-10-CM | POA: Diagnosis not present

## 2018-10-29 DIAGNOSIS — H2512 Age-related nuclear cataract, left eye: Secondary | ICD-10-CM | POA: Diagnosis not present

## 2018-10-29 DIAGNOSIS — Z79899 Other long term (current) drug therapy: Secondary | ICD-10-CM | POA: Diagnosis not present

## 2018-10-29 DIAGNOSIS — B0052 Herpesviral keratitis: Secondary | ICD-10-CM | POA: Diagnosis not present

## 2018-11-06 DIAGNOSIS — B0053 Herpesviral conjunctivitis: Secondary | ICD-10-CM | POA: Diagnosis not present

## 2018-11-06 DIAGNOSIS — E1169 Type 2 diabetes mellitus with other specified complication: Secondary | ICD-10-CM | POA: Diagnosis not present

## 2018-11-06 DIAGNOSIS — E785 Hyperlipidemia, unspecified: Secondary | ICD-10-CM | POA: Diagnosis not present

## 2018-11-24 DIAGNOSIS — H2513 Age-related nuclear cataract, bilateral: Secondary | ICD-10-CM | POA: Diagnosis not present

## 2018-11-24 DIAGNOSIS — H179 Unspecified corneal scar and opacity: Secondary | ICD-10-CM | POA: Diagnosis not present

## 2018-11-24 DIAGNOSIS — B0052 Herpesviral keratitis: Secondary | ICD-10-CM | POA: Diagnosis not present

## 2018-11-30 DIAGNOSIS — H179 Unspecified corneal scar and opacity: Secondary | ICD-10-CM | POA: Diagnosis not present

## 2018-11-30 DIAGNOSIS — B0052 Herpesviral keratitis: Secondary | ICD-10-CM | POA: Diagnosis not present

## 2018-11-30 DIAGNOSIS — Z79899 Other long term (current) drug therapy: Secondary | ICD-10-CM | POA: Diagnosis not present

## 2018-11-30 DIAGNOSIS — H2513 Age-related nuclear cataract, bilateral: Secondary | ICD-10-CM | POA: Diagnosis not present

## 2019-01-04 DIAGNOSIS — J101 Influenza due to other identified influenza virus with other respiratory manifestations: Secondary | ICD-10-CM | POA: Diagnosis not present

## 2019-01-04 DIAGNOSIS — U071 COVID-19: Secondary | ICD-10-CM | POA: Diagnosis not present

## 2019-01-27 DIAGNOSIS — B9689 Other specified bacterial agents as the cause of diseases classified elsewhere: Secondary | ICD-10-CM | POA: Diagnosis not present

## 2019-01-27 DIAGNOSIS — J019 Acute sinusitis, unspecified: Secondary | ICD-10-CM | POA: Diagnosis not present

## 2019-01-27 DIAGNOSIS — H02843 Edema of right eye, unspecified eyelid: Secondary | ICD-10-CM | POA: Diagnosis not present

## 2019-02-01 DIAGNOSIS — I700 Atherosclerosis of aorta: Secondary | ICD-10-CM | POA: Diagnosis not present

## 2019-02-01 DIAGNOSIS — I7090 Unspecified atherosclerosis: Secondary | ICD-10-CM | POA: Diagnosis not present

## 2019-02-01 DIAGNOSIS — R1013 Epigastric pain: Secondary | ICD-10-CM | POA: Diagnosis not present

## 2019-02-01 DIAGNOSIS — K449 Diaphragmatic hernia without obstruction or gangrene: Secondary | ICD-10-CM | POA: Diagnosis not present

## 2019-02-01 DIAGNOSIS — R918 Other nonspecific abnormal finding of lung field: Secondary | ICD-10-CM | POA: Diagnosis not present

## 2019-02-09 DIAGNOSIS — Z1231 Encounter for screening mammogram for malignant neoplasm of breast: Secondary | ICD-10-CM | POA: Diagnosis not present

## 2019-02-09 DIAGNOSIS — Z1331 Encounter for screening for depression: Secondary | ICD-10-CM | POA: Diagnosis not present

## 2019-02-09 DIAGNOSIS — Z136 Encounter for screening for cardiovascular disorders: Secondary | ICD-10-CM | POA: Diagnosis not present

## 2019-02-09 DIAGNOSIS — Z9181 History of falling: Secondary | ICD-10-CM | POA: Diagnosis not present

## 2019-02-09 DIAGNOSIS — Z0000 Encounter for general adult medical examination without abnormal findings: Secondary | ICD-10-CM | POA: Diagnosis not present

## 2019-02-09 DIAGNOSIS — E785 Hyperlipidemia, unspecified: Secondary | ICD-10-CM | POA: Diagnosis not present

## 2019-02-16 DIAGNOSIS — E119 Type 2 diabetes mellitus without complications: Secondary | ICD-10-CM | POA: Diagnosis not present

## 2019-02-16 DIAGNOSIS — R1013 Epigastric pain: Secondary | ICD-10-CM | POA: Diagnosis not present

## 2019-02-16 DIAGNOSIS — Z9889 Other specified postprocedural states: Secondary | ICD-10-CM | POA: Diagnosis not present

## 2019-02-16 DIAGNOSIS — Z7982 Long term (current) use of aspirin: Secondary | ICD-10-CM | POA: Diagnosis not present

## 2019-02-16 DIAGNOSIS — I771 Stricture of artery: Secondary | ICD-10-CM | POA: Diagnosis not present

## 2019-02-16 DIAGNOSIS — K449 Diaphragmatic hernia without obstruction or gangrene: Secondary | ICD-10-CM | POA: Diagnosis not present

## 2019-02-16 DIAGNOSIS — K551 Chronic vascular disorders of intestine: Secondary | ICD-10-CM | POA: Diagnosis not present

## 2019-02-16 DIAGNOSIS — I723 Aneurysm of iliac artery: Secondary | ICD-10-CM | POA: Diagnosis not present

## 2019-02-16 DIAGNOSIS — Z8673 Personal history of transient ischemic attack (TIA), and cerebral infarction without residual deficits: Secondary | ICD-10-CM | POA: Diagnosis not present

## 2019-02-16 DIAGNOSIS — E785 Hyperlipidemia, unspecified: Secondary | ICD-10-CM | POA: Diagnosis not present

## 2019-03-03 DIAGNOSIS — C9110 Chronic lymphocytic leukemia of B-cell type not having achieved remission: Secondary | ICD-10-CM | POA: Diagnosis not present

## 2019-03-03 DIAGNOSIS — E1169 Type 2 diabetes mellitus with other specified complication: Secondary | ICD-10-CM | POA: Diagnosis not present

## 2019-03-03 DIAGNOSIS — E785 Hyperlipidemia, unspecified: Secondary | ICD-10-CM | POA: Diagnosis not present

## 2019-03-03 DIAGNOSIS — S8010XA Contusion of unspecified lower leg, initial encounter: Secondary | ICD-10-CM | POA: Diagnosis not present

## 2019-03-17 DIAGNOSIS — R197 Diarrhea, unspecified: Secondary | ICD-10-CM | POA: Diagnosis not present

## 2019-03-18 DIAGNOSIS — R197 Diarrhea, unspecified: Secondary | ICD-10-CM | POA: Diagnosis not present

## 2019-03-31 DIAGNOSIS — R1011 Right upper quadrant pain: Secondary | ICD-10-CM | POA: Diagnosis not present

## 2019-03-31 DIAGNOSIS — K449 Diaphragmatic hernia without obstruction or gangrene: Secondary | ICD-10-CM | POA: Diagnosis not present

## 2019-06-16 DIAGNOSIS — M19041 Primary osteoarthritis, right hand: Secondary | ICD-10-CM | POA: Diagnosis not present

## 2019-06-16 DIAGNOSIS — J340 Abscess, furuncle and carbuncle of nose: Secondary | ICD-10-CM | POA: Diagnosis not present

## 2019-06-30 DIAGNOSIS — J341 Cyst and mucocele of nose and nasal sinus: Secondary | ICD-10-CM | POA: Diagnosis not present

## 2019-08-05 DIAGNOSIS — E785 Hyperlipidemia, unspecified: Secondary | ICD-10-CM | POA: Diagnosis not present

## 2019-08-05 DIAGNOSIS — R109 Unspecified abdominal pain: Secondary | ICD-10-CM | POA: Diagnosis not present

## 2019-08-05 DIAGNOSIS — E1169 Type 2 diabetes mellitus with other specified complication: Secondary | ICD-10-CM | POA: Diagnosis not present

## 2019-08-05 DIAGNOSIS — N390 Urinary tract infection, site not specified: Secondary | ICD-10-CM | POA: Diagnosis not present

## 2019-08-05 DIAGNOSIS — R3129 Other microscopic hematuria: Secondary | ICD-10-CM | POA: Diagnosis not present

## 2019-08-06 DIAGNOSIS — R319 Hematuria, unspecified: Secondary | ICD-10-CM | POA: Diagnosis not present

## 2019-08-06 DIAGNOSIS — R590 Localized enlarged lymph nodes: Secondary | ICD-10-CM | POA: Diagnosis not present

## 2019-08-06 DIAGNOSIS — R109 Unspecified abdominal pain: Secondary | ICD-10-CM | POA: Diagnosis not present

## 2019-08-06 DIAGNOSIS — R161 Splenomegaly, not elsewhere classified: Secondary | ICD-10-CM | POA: Diagnosis not present

## 2019-08-06 DIAGNOSIS — J90 Pleural effusion, not elsewhere classified: Secondary | ICD-10-CM | POA: Diagnosis not present

## 2019-08-06 DIAGNOSIS — I700 Atherosclerosis of aorta: Secondary | ICD-10-CM | POA: Diagnosis not present

## 2019-08-06 DIAGNOSIS — R300 Dysuria: Secondary | ICD-10-CM | POA: Diagnosis not present

## 2019-08-06 DIAGNOSIS — E119 Type 2 diabetes mellitus without complications: Secondary | ICD-10-CM | POA: Diagnosis not present

## 2019-08-09 DIAGNOSIS — R6889 Other general symptoms and signs: Secondary | ICD-10-CM | POA: Diagnosis not present

## 2019-08-09 DIAGNOSIS — J029 Acute pharyngitis, unspecified: Secondary | ICD-10-CM | POA: Diagnosis not present

## 2019-08-15 DIAGNOSIS — J9811 Atelectasis: Secondary | ICD-10-CM | POA: Diagnosis not present

## 2019-08-15 DIAGNOSIS — K869 Disease of pancreas, unspecified: Secondary | ICD-10-CM | POA: Diagnosis not present

## 2019-08-15 DIAGNOSIS — R110 Nausea: Secondary | ICD-10-CM | POA: Diagnosis not present

## 2019-08-15 DIAGNOSIS — R9431 Abnormal electrocardiogram [ECG] [EKG]: Secondary | ICD-10-CM | POA: Diagnosis not present

## 2019-08-15 DIAGNOSIS — R161 Splenomegaly, not elsewhere classified: Secondary | ICD-10-CM | POA: Diagnosis not present

## 2019-08-15 DIAGNOSIS — R1013 Epigastric pain: Secondary | ICD-10-CM | POA: Diagnosis not present

## 2019-08-15 DIAGNOSIS — J90 Pleural effusion, not elsewhere classified: Secondary | ICD-10-CM | POA: Diagnosis not present

## 2019-08-16 DIAGNOSIS — R1013 Epigastric pain: Secondary | ICD-10-CM | POA: Diagnosis not present

## 2019-08-17 DIAGNOSIS — R300 Dysuria: Secondary | ICD-10-CM | POA: Diagnosis not present

## 2019-08-17 DIAGNOSIS — B373 Candidiasis of vulva and vagina: Secondary | ICD-10-CM | POA: Diagnosis not present

## 2019-08-17 DIAGNOSIS — K869 Disease of pancreas, unspecified: Secondary | ICD-10-CM | POA: Diagnosis not present

## 2019-08-19 DIAGNOSIS — E1151 Type 2 diabetes mellitus with diabetic peripheral angiopathy without gangrene: Secondary | ICD-10-CM | POA: Diagnosis not present

## 2019-08-19 DIAGNOSIS — K551 Chronic vascular disorders of intestine: Secondary | ICD-10-CM | POA: Diagnosis not present

## 2019-08-19 DIAGNOSIS — I77819 Aortic ectasia, unspecified site: Secondary | ICD-10-CM | POA: Diagnosis not present

## 2019-08-19 DIAGNOSIS — I723 Aneurysm of iliac artery: Secondary | ICD-10-CM | POA: Diagnosis not present

## 2019-08-19 DIAGNOSIS — E785 Hyperlipidemia, unspecified: Secondary | ICD-10-CM | POA: Diagnosis not present

## 2019-08-27 DIAGNOSIS — R591 Generalized enlarged lymph nodes: Secondary | ICD-10-CM | POA: Diagnosis not present

## 2019-08-27 DIAGNOSIS — D696 Thrombocytopenia, unspecified: Secondary | ICD-10-CM | POA: Diagnosis not present

## 2019-08-27 DIAGNOSIS — C9192 Lymphoid leukemia, unspecified, in relapse: Secondary | ICD-10-CM | POA: Diagnosis not present

## 2019-08-27 DIAGNOSIS — C9110 Chronic lymphocytic leukemia of B-cell type not having achieved remission: Secondary | ICD-10-CM | POA: Diagnosis not present

## 2019-08-27 DIAGNOSIS — R161 Splenomegaly, not elsewhere classified: Secondary | ICD-10-CM | POA: Diagnosis not present

## 2019-09-03 DIAGNOSIS — I700 Atherosclerosis of aorta: Secondary | ICD-10-CM | POA: Diagnosis not present

## 2019-09-03 DIAGNOSIS — C9110 Chronic lymphocytic leukemia of B-cell type not having achieved remission: Secondary | ICD-10-CM | POA: Diagnosis not present

## 2019-09-03 DIAGNOSIS — J90 Pleural effusion, not elsewhere classified: Secondary | ICD-10-CM | POA: Diagnosis not present

## 2019-09-03 DIAGNOSIS — R161 Splenomegaly, not elsewhere classified: Secondary | ICD-10-CM | POA: Diagnosis not present

## 2019-09-03 DIAGNOSIS — E1165 Type 2 diabetes mellitus with hyperglycemia: Secondary | ICD-10-CM | POA: Diagnosis not present

## 2019-09-07 DIAGNOSIS — C8597 Non-Hodgkin lymphoma, unspecified, spleen: Secondary | ICD-10-CM | POA: Diagnosis not present

## 2019-09-07 DIAGNOSIS — C8309 Small cell B-cell lymphoma, extranodal and solid organ sites: Secondary | ICD-10-CM | POA: Diagnosis not present

## 2019-09-07 DIAGNOSIS — R161 Splenomegaly, not elsewhere classified: Secondary | ICD-10-CM | POA: Diagnosis not present

## 2019-09-07 DIAGNOSIS — D696 Thrombocytopenia, unspecified: Secondary | ICD-10-CM | POA: Diagnosis not present

## 2019-09-07 DIAGNOSIS — C9192 Lymphoid leukemia, unspecified, in relapse: Secondary | ICD-10-CM | POA: Diagnosis not present

## 2019-09-07 DIAGNOSIS — C8510 Unspecified B-cell lymphoma, unspecified site: Secondary | ICD-10-CM | POA: Diagnosis not present

## 2019-09-09 DIAGNOSIS — R197 Diarrhea, unspecified: Secondary | ICD-10-CM | POA: Diagnosis not present

## 2019-09-21 DIAGNOSIS — R161 Splenomegaly, not elsewhere classified: Secondary | ICD-10-CM | POA: Diagnosis not present

## 2019-09-21 DIAGNOSIS — C9110 Chronic lymphocytic leukemia of B-cell type not having achieved remission: Secondary | ICD-10-CM | POA: Diagnosis not present

## 2019-09-21 DIAGNOSIS — D6959 Other secondary thrombocytopenia: Secondary | ICD-10-CM | POA: Diagnosis not present

## 2019-09-21 DIAGNOSIS — C8307 Small cell B-cell lymphoma, spleen: Secondary | ICD-10-CM | POA: Diagnosis not present

## 2019-09-21 DIAGNOSIS — Z5112 Encounter for antineoplastic immunotherapy: Secondary | ICD-10-CM | POA: Diagnosis not present

## 2019-09-21 DIAGNOSIS — D709 Neutropenia, unspecified: Secondary | ICD-10-CM | POA: Diagnosis not present

## 2019-11-03 DIAGNOSIS — C8510 Unspecified B-cell lymphoma, unspecified site: Secondary | ICD-10-CM | POA: Diagnosis not present

## 2019-11-12 DIAGNOSIS — R21 Rash and other nonspecific skin eruption: Secondary | ICD-10-CM | POA: Diagnosis not present

## 2019-11-12 DIAGNOSIS — W57XXXA Bitten or stung by nonvenomous insect and other nonvenomous arthropods, initial encounter: Secondary | ICD-10-CM | POA: Diagnosis not present

## 2019-11-12 DIAGNOSIS — S40862A Insect bite (nonvenomous) of left upper arm, initial encounter: Secondary | ICD-10-CM | POA: Diagnosis not present

## 2019-11-16 ENCOUNTER — Other Ambulatory Visit: Payer: Self-pay

## 2019-11-16 DIAGNOSIS — S40862D Insect bite (nonvenomous) of left upper arm, subsequent encounter: Secondary | ICD-10-CM | POA: Diagnosis not present

## 2019-11-16 DIAGNOSIS — R21 Rash and other nonspecific skin eruption: Secondary | ICD-10-CM | POA: Diagnosis not present

## 2019-11-16 DIAGNOSIS — W57XXXD Bitten or stung by nonvenomous insect and other nonvenomous arthropods, subsequent encounter: Secondary | ICD-10-CM | POA: Diagnosis not present

## 2019-12-04 DIAGNOSIS — B029 Zoster without complications: Secondary | ICD-10-CM | POA: Diagnosis not present

## 2020-01-03 DIAGNOSIS — B9689 Other specified bacterial agents as the cause of diseases classified elsewhere: Secondary | ICD-10-CM | POA: Diagnosis not present

## 2020-01-03 DIAGNOSIS — J019 Acute sinusitis, unspecified: Secondary | ICD-10-CM | POA: Diagnosis not present

## 2020-01-12 DIAGNOSIS — S82201D Unspecified fracture of shaft of right tibia, subsequent encounter for closed fracture with routine healing: Secondary | ICD-10-CM | POA: Diagnosis not present

## 2020-01-12 DIAGNOSIS — M79661 Pain in right lower leg: Secondary | ICD-10-CM | POA: Diagnosis not present

## 2020-01-23 DIAGNOSIS — Z20828 Contact with and (suspected) exposure to other viral communicable diseases: Secondary | ICD-10-CM | POA: Diagnosis not present

## 2020-01-23 DIAGNOSIS — R509 Fever, unspecified: Secondary | ICD-10-CM | POA: Diagnosis not present

## 2020-02-08 DIAGNOSIS — Z20828 Contact with and (suspected) exposure to other viral communicable diseases: Secondary | ICD-10-CM | POA: Diagnosis not present

## 2020-02-10 DIAGNOSIS — N898 Other specified noninflammatory disorders of vagina: Secondary | ICD-10-CM | POA: Diagnosis not present

## 2020-02-14 DIAGNOSIS — N898 Other specified noninflammatory disorders of vagina: Secondary | ICD-10-CM | POA: Diagnosis not present

## 2020-02-14 DIAGNOSIS — N2889 Other specified disorders of kidney and ureter: Secondary | ICD-10-CM | POA: Diagnosis not present

## 2020-02-14 DIAGNOSIS — R3989 Other symptoms and signs involving the genitourinary system: Secondary | ICD-10-CM | POA: Diagnosis not present

## 2020-02-15 DIAGNOSIS — E785 Hyperlipidemia, unspecified: Secondary | ICD-10-CM | POA: Diagnosis not present

## 2020-02-15 DIAGNOSIS — Z9181 History of falling: Secondary | ICD-10-CM | POA: Diagnosis not present

## 2020-02-15 DIAGNOSIS — Z0000 Encounter for general adult medical examination without abnormal findings: Secondary | ICD-10-CM | POA: Diagnosis not present

## 2020-02-15 DIAGNOSIS — Z1331 Encounter for screening for depression: Secondary | ICD-10-CM | POA: Diagnosis not present

## 2020-02-15 DIAGNOSIS — Z139 Encounter for screening, unspecified: Secondary | ICD-10-CM | POA: Diagnosis not present

## 2020-02-23 DIAGNOSIS — Z79899 Other long term (current) drug therapy: Secondary | ICD-10-CM | POA: Diagnosis not present

## 2020-02-23 DIAGNOSIS — N952 Postmenopausal atrophic vaginitis: Secondary | ICD-10-CM | POA: Diagnosis not present

## 2020-02-23 DIAGNOSIS — N3946 Mixed incontinence: Secondary | ICD-10-CM | POA: Diagnosis not present

## 2020-02-23 DIAGNOSIS — R3982 Chronic bladder pain: Secondary | ICD-10-CM | POA: Diagnosis not present

## 2020-02-23 DIAGNOSIS — B373 Candidiasis of vulva and vagina: Secondary | ICD-10-CM | POA: Diagnosis not present

## 2020-02-23 DIAGNOSIS — R3129 Other microscopic hematuria: Secondary | ICD-10-CM | POA: Diagnosis not present

## 2020-02-23 DIAGNOSIS — K5909 Other constipation: Secondary | ICD-10-CM | POA: Diagnosis not present

## 2020-02-24 DIAGNOSIS — B373 Candidiasis of vulva and vagina: Secondary | ICD-10-CM | POA: Diagnosis not present

## 2020-02-24 DIAGNOSIS — E785 Hyperlipidemia, unspecified: Secondary | ICD-10-CM | POA: Diagnosis not present

## 2020-02-24 DIAGNOSIS — R531 Weakness: Secondary | ICD-10-CM | POA: Diagnosis not present

## 2020-02-24 DIAGNOSIS — E1169 Type 2 diabetes mellitus with other specified complication: Secondary | ICD-10-CM | POA: Diagnosis not present

## 2020-02-24 DIAGNOSIS — N952 Postmenopausal atrophic vaginitis: Secondary | ICD-10-CM | POA: Diagnosis not present

## 2020-03-27 DIAGNOSIS — H5213 Myopia, bilateral: Secondary | ICD-10-CM | POA: Diagnosis not present

## 2020-03-27 DIAGNOSIS — H52203 Unspecified astigmatism, bilateral: Secondary | ICD-10-CM | POA: Diagnosis not present

## 2020-03-27 DIAGNOSIS — H2513 Age-related nuclear cataract, bilateral: Secondary | ICD-10-CM | POA: Diagnosis not present

## 2020-03-27 DIAGNOSIS — H524 Presbyopia: Secondary | ICD-10-CM | POA: Diagnosis not present

## 2020-03-27 DIAGNOSIS — H179 Unspecified corneal scar and opacity: Secondary | ICD-10-CM | POA: Diagnosis not present

## 2020-04-17 DIAGNOSIS — R102 Pelvic and perineal pain: Secondary | ICD-10-CM | POA: Diagnosis not present

## 2020-04-17 DIAGNOSIS — B373 Candidiasis of vulva and vagina: Secondary | ICD-10-CM | POA: Diagnosis not present

## 2020-04-17 DIAGNOSIS — B9681 Helicobacter pylori [H. pylori] as the cause of diseases classified elsewhere: Secondary | ICD-10-CM | POA: Diagnosis not present

## 2020-04-17 DIAGNOSIS — N952 Postmenopausal atrophic vaginitis: Secondary | ICD-10-CM | POA: Diagnosis not present

## 2020-04-17 DIAGNOSIS — R3129 Other microscopic hematuria: Secondary | ICD-10-CM | POA: Diagnosis not present

## 2020-04-17 DIAGNOSIS — K2980 Duodenitis without bleeding: Secondary | ICD-10-CM | POA: Diagnosis not present

## 2020-05-14 DIAGNOSIS — J209 Acute bronchitis, unspecified: Secondary | ICD-10-CM | POA: Diagnosis not present

## 2020-05-14 DIAGNOSIS — Z20828 Contact with and (suspected) exposure to other viral communicable diseases: Secondary | ICD-10-CM | POA: Diagnosis not present

## 2020-05-14 DIAGNOSIS — R0981 Nasal congestion: Secondary | ICD-10-CM | POA: Diagnosis not present

## 2020-05-14 DIAGNOSIS — R059 Cough, unspecified: Secondary | ICD-10-CM | POA: Diagnosis not present

## 2020-05-16 DIAGNOSIS — D649 Anemia, unspecified: Secondary | ICD-10-CM | POA: Diagnosis not present

## 2020-05-16 DIAGNOSIS — R531 Weakness: Secondary | ICD-10-CM | POA: Diagnosis not present

## 2020-05-16 DIAGNOSIS — E871 Hypo-osmolality and hyponatremia: Secondary | ICD-10-CM | POA: Diagnosis not present

## 2020-05-16 DIAGNOSIS — R059 Cough, unspecified: Secondary | ICD-10-CM | POA: Diagnosis not present

## 2020-05-16 DIAGNOSIS — D61818 Other pancytopenia: Secondary | ICD-10-CM | POA: Diagnosis not present

## 2020-05-16 DIAGNOSIS — U071 COVID-19: Secondary | ICD-10-CM | POA: Diagnosis not present

## 2020-05-16 DIAGNOSIS — D696 Thrombocytopenia, unspecified: Secondary | ICD-10-CM | POA: Diagnosis not present

## 2020-05-16 DIAGNOSIS — R509 Fever, unspecified: Secondary | ICD-10-CM | POA: Diagnosis not present

## 2020-05-16 DIAGNOSIS — J90 Pleural effusion, not elsewhere classified: Secondary | ICD-10-CM | POA: Diagnosis not present

## 2020-05-19 ENCOUNTER — Emergency Department (HOSPITAL_COMMUNITY)
Admission: EM | Admit: 2020-05-19 | Discharge: 2020-05-19 | Disposition: A | Discharge status: Home / Self Care | Payer: PPO | Attending: Emergency Medicine | Admitting: Emergency Medicine

## 2020-05-19 ENCOUNTER — Emergency Department (HOSPITAL_COMMUNITY): Payer: PPO

## 2020-05-19 ENCOUNTER — Encounter (HOSPITAL_COMMUNITY): Payer: Self-pay | Admitting: Emergency Medicine

## 2020-05-19 DIAGNOSIS — R0602 Shortness of breath: Secondary | ICD-10-CM | POA: Diagnosis not present

## 2020-05-19 DIAGNOSIS — R059 Cough, unspecified: Secondary | ICD-10-CM | POA: Diagnosis present

## 2020-05-19 DIAGNOSIS — U071 COVID-19: Secondary | ICD-10-CM | POA: Insufficient documentation

## 2020-05-19 DIAGNOSIS — R000 Tachycardia, unspecified: Secondary | ICD-10-CM | POA: Diagnosis not present

## 2020-05-19 DIAGNOSIS — J9811 Atelectasis: Secondary | ICD-10-CM | POA: Diagnosis not present

## 2020-05-19 DIAGNOSIS — J90 Pleural effusion, not elsewhere classified: Secondary | ICD-10-CM | POA: Diagnosis not present

## 2020-05-19 HISTORY — DX: Leukemia, unspecified not having achieved remission: C95.90

## 2020-05-19 LAB — CBC WITH DIFFERENTIAL/PLATELET
Abs Immature Granulocytes: 0.01 10*3/uL (ref 0.00–0.07)
Basophils Absolute: 0 10*3/uL (ref 0.0–0.1)
Basophils Relative: 0 %
Eosinophils Absolute: 0 10*3/uL (ref 0.0–0.5)
Eosinophils Relative: 1 %
HCT: 38 % (ref 36.0–46.0)
Hemoglobin: 11.7 g/dL — ABNORMAL LOW (ref 12.0–15.0)
Immature Granulocytes: 0 %
Lymphocytes Relative: 54 %
Lymphs Abs: 1.6 10*3/uL (ref 0.7–4.0)
MCH: 28.1 pg (ref 26.0–34.0)
MCHC: 30.8 g/dL (ref 30.0–36.0)
MCV: 91.3 fL (ref 80.0–100.0)
Monocytes Absolute: 0.1 10*3/uL (ref 0.1–1.0)
Monocytes Relative: 5 %
Neutro Abs: 1.1 10*3/uL — ABNORMAL LOW (ref 1.7–7.7)
Neutrophils Relative %: 40 %
Platelets: 61 10*3/uL — ABNORMAL LOW (ref 150–400)
RBC: 4.16 MIL/uL (ref 3.87–5.11)
RDW: 12.7 % (ref 11.5–15.5)
WBC: 2.9 10*3/uL — ABNORMAL LOW (ref 4.0–10.5)
nRBC: 0 % (ref 0.0–0.2)

## 2020-05-19 LAB — LACTIC ACID, PLASMA: Lactic Acid, Venous: 1.3 mmol/L (ref 0.5–1.9)

## 2020-05-19 LAB — COMPREHENSIVE METABOLIC PANEL
ALT: 17 U/L (ref 0–44)
AST: 27 U/L (ref 15–41)
Albumin: 3.7 g/dL (ref 3.5–5.0)
Alkaline Phosphatase: 68 U/L (ref 38–126)
Anion gap: 12 (ref 5–15)
BUN: 11 mg/dL (ref 8–23)
CO2: 22 mmol/L (ref 22–32)
Calcium: 8.8 mg/dL — ABNORMAL LOW (ref 8.9–10.3)
Chloride: 102 mmol/L (ref 98–111)
Creatinine, Ser: 0.83 mg/dL (ref 0.44–1.00)
GFR, Estimated: >60 mL/min (ref >60)
Glucose, Bld: 130 mg/dL — ABNORMAL HIGH (ref 70–99)
Potassium: 3.9 mmol/L (ref 3.5–5.1)
Sodium: 136 mmol/L (ref 135–145)
Total Bilirubin: 0.9 mg/dL (ref 0.3–1.2)
Total Protein: 5.7 g/dL — ABNORMAL LOW (ref 6.5–8.1)

## 2020-05-19 LAB — URINALYSIS, ROUTINE W REFLEX MICROSCOPIC
Bilirubin Urine: NEGATIVE
Glucose, UA: NEGATIVE mg/dL
Ketones, ur: NEGATIVE mg/dL
Leukocytes,Ua: NEGATIVE
Nitrite: NEGATIVE
Protein, ur: 30 mg/dL — AB
Specific Gravity, Urine: 1.021 (ref 1.005–1.030)
pH: 5 (ref 5.0–8.0)

## 2020-05-19 MED ORDER — HYDROCOD POLST-CPM POLST ER 10-8 MG/5ML PO SUER: 5.0000 mL | Freq: Two times a day (BID) | ORAL | 0 refills | Status: AC | PRN

## 2020-05-19 MED ORDER — ACETAMINOPHEN 325 MG PO TABS
650.0000 mg | ORAL_TABLET | Freq: Once | ORAL | Status: AC | PRN
  Administered 2020-05-19: 650 mg via ORAL
  Filled 2020-05-19: qty 2

## 2020-05-19 NOTE — ED Triage Notes (Signed)
Patient with COVID and shortness of breath states she was told to come to [facility] ED for monoclonal antibody treatment. Patient tested positive for COVID at [facility] (result available in [place]). History of leukemia.

## 2020-05-19 NOTE — ED Provider Notes (Signed)
[facility] EMERGENCY DEPARTMENT Provider Note   CSN: [id number] Arrival date & time: 05/19/20  1836     History Chief Complaint  Patient presents with  . COVID+    Alison Livingston is a 75 y.o. female.  Patient reports stating that she recently tested positive for Covid-19.  She reports that she is experiencing a dry cough and congestion.  She states that she had night sweats last night.  She denies any diarrhea, upset stomach and does report decreased appetite.  She reports that she was able to eat a biscuit this morning.  Patient states that she is hoping to receive the monoclonal antibody infusion for COVID.  Patient will not receive COVID-19 vaccination.  She reports history of leukemia.  She is not receiving any active treatment.  Patient states that she does not want to be on a ventilator if she were to experience respiratory failure.        Past Medical History:  Diagnosis Date  . Leukemia ([place])     There are no problems to display for this patient.   History reviewed. No pertinent surgical history.   OB History   No obstetric history on file.     No family history on file.     Home Medications Prior to Admission medications   Medication Sig Start Date End Date Taking? Authorizing Provider  chlorpheniramine-HYDROcodone (TUSSIONEX PENNKINETIC ER) 10-8 MG/5ML SUER Take 5 mLs by mouth every 12 (twelve) hours as needed for cough. 05/19/20  Yes Simmons-Robinson, Riki Sheer, MD    Allergies    Patient has no known allergies.  Review of Systems   Review of Systems  Constitutional: Positive for fever.  HENT: Positive for congestion.   Eyes: Negative for pain.  Respiratory: Positive for cough. Negative for chest tightness, shortness of breath and wheezing.   Gastrointestinal: Negative for abdominal pain, diarrhea and nausea.  Endocrine: Negative for polyuria.  Genitourinary: Negative for difficulty urinating.  Musculoskeletal: Negative for neck  pain.  Neurological: Negative for dizziness, weakness and headaches.  Psychiatric/Behavioral: Negative for confusion.    Physical Exam Updated Vital Signs BP 135/68 (BP Location: Right Arm)   Pulse (!) 108   Temp 98.7 F (37.1 C) (Oral)   Resp (!) 28   SpO2 96%   Physical Exam Constitutional:      General: She is not in acute distress.    Appearance: Normal appearance. She is not ill-appearing.  HENT:     Right Ear: External ear normal.     Left Ear: External ear normal.  Eyes:     Extraocular Movements: Extraocular movements intact.  Cardiovascular:     Rate and Rhythm: Regular rhythm. Tachycardia present.     Pulses: Normal pulses.     Heart sounds: Normal heart sounds.  Pulmonary:     Effort: Pulmonary effort is normal. No respiratory distress.     Breath sounds: Normal breath sounds. No wheezing or rales.     Comments: Intermittent dry cough  Abdominal:     General: Bowel sounds are normal.     Palpations: Abdomen is soft.     Tenderness: There is no abdominal tenderness.  Musculoskeletal:        General: Normal range of motion.  Lymphadenopathy:     Cervical: No cervical adenopathy.  Skin:    General: Skin is dry.     Capillary Refill: Capillary refill takes less than 2 seconds.     Coloration: Skin is not pale.  Findings: No rash.  Neurological:     General: No focal deficit present.     Mental Status: She is alert and oriented to person, place, and time.     ED Results / Procedures / Treatments   Labs (all labs ordered are listed, but only abnormal results are displayed) Labs Reviewed  COMPREHENSIVE METABOLIC PANEL - Abnormal; Notable for the following components:      Result Value   Glucose, Bld 130 (*)    Calcium 8.8 (*)    Total Protein 5.7 (*)    All other components within normal limits  CBC WITH DIFFERENTIAL/PLATELET - Abnormal; Notable for the following components:   WBC 2.9 (*)    Hemoglobin 11.7 (*)    Platelets 61 (*)    Neutro Abs  1.1 (*)    All other components within normal limits  URINALYSIS, ROUTINE W REFLEX MICROSCOPIC - Abnormal; Notable for the following components:   Hgb urine dipstick SMALL (*)    Protein, ur 30 (*)    Bacteria, UA RARE (*)    All other components within normal limits  LACTIC ACID, PLASMA    EKG None  Radiology DG Chest Portable 1 View  Result Date: 05/19/2020 CLINICAL DATA:  Shortness of breath, COVID EXAM: PORTABLE CHEST 1 VIEW COMPARISON:  05/16/2020 FINDINGS: Small bilateral pleural effusions, bibasilar atelectasis. Heart is borderline in size. No edema. No acute bony abnormality. IMPRESSION: Small effusions with bibasilar atelectasis. Electronically Signed   By: Rolm Baptise M.D.   On: 05/19/2020 19:25    Procedures Procedures  Medications Ordered in ED Medications  acetaminophen (TYLENOL) tablet 650 mg (650 mg Oral Given 05/19/20 1859)    ED Course  I have reviewed the triage vital signs and the nursing notes.  Pertinent labs & imaging results that were available during my care of the patient were reviewed by me and considered in my medical decision making (see chart for details).    MDM Rules/Calculators/A&P                          Alison Livingston is a 75 y.o. female who presented to ED for recent positive test for Covid 19 infection requesting monoclonal antibody infusion.  Patient reports history of leukemia.  Patient appears to have mild disease with oxygen saturations 96% and above.  She does not have crackles or wheezing on exam.  She does have intermittent cough. Will refer for patient to receive outpatient MAB infusion given her current leukemia. Patient is afebrile and has small atelectasis on CXR. U/A is not notable for nitrites or leukocytes, does appear to have small amount of Hgb that is normal per the patient. Metabolic panel unremarkable, just noted for elevated blood glucose of 130. CBC with neutropenia as expected given current leukemia diagnosis. Prescribed  TussionEx upon discharge. Patient prefers to be home for isolation.  Final Clinical Impression(s) / ED Diagnoses Final diagnoses:  [id number] virus infection    Rx / DC Orders ED Discharge Orders         Ordered    Ambulatory referral for Covid Treatment        05/19/20 2057    chlorpheniramine-HYDROcodone (TUSSIONEX PENNKINETIC ER) 10-8 MG/5ML SUER  Every 12 hours PRN        05/19/20 2113           Eulis Foster, MD 05/19/20 2121    Isla Pence, MD 05/19/20 2211

## 2020-05-19 NOTE — Discharge Instructions (Addendum)
You were evaluated in the ED for your recent Covid-19 diagnosis.  We recommended she quarantine at home and wear a mask around any other individuals in your home for the next 5 days.  You should avoid getting with others and to your symptoms have resolve and you have been without a fever for 24 hours without the assistance of medication. It is okay to take Tylenol to help with your fever.  Given your history of leukemia, we have referred you to the Monoclonal antibody infusion clinic.

## 2020-05-20 ENCOUNTER — Telehealth: Payer: Self-pay | Admitting: Physician Assistant

## 2020-05-20 NOTE — Telephone Encounter (Signed)
Called to discuss with Florestine Avers about Covid symptoms and the use of sotrovimab, remdisivir or oral therapies for those with mild to moderate Covid symptoms and at a high risk of hospitalization.     Pt is qualified, but due to medication shortages we cannot offer the pt the infusion at this time. She is on day 10.  This decision was based on clinical judgement and the NIH Covid 19 treatment guidelines for treatment prioritization and equitable access. Symptoms tier reviewed as well as criteria for ending isolation. Preventative practices reviewed. Patient verbalized understanding   There are no problems to display for this patient.   Angelena Form PA-C

## 2020-05-21 DIAGNOSIS — U071 COVID-19: Secondary | ICD-10-CM | POA: Diagnosis not present

## 2020-05-26 DIAGNOSIS — U099 Post covid-19 condition, unspecified: Secondary | ICD-10-CM | POA: Diagnosis not present

## 2020-05-26 DIAGNOSIS — J1282 Pneumonia due to coronavirus disease 2019: Secondary | ICD-10-CM | POA: Diagnosis not present

## 2020-05-26 DIAGNOSIS — Z8673 Personal history of transient ischemic attack (TIA), and cerebral infarction without residual deficits: Secondary | ICD-10-CM | POA: Diagnosis not present

## 2020-05-26 DIAGNOSIS — R091 Pleurisy: Secondary | ICD-10-CM | POA: Diagnosis not present

## 2020-05-26 DIAGNOSIS — R059 Cough, unspecified: Secondary | ICD-10-CM | POA: Diagnosis not present

## 2020-05-26 DIAGNOSIS — Z881 Allergy status to other antibiotic agents status: Secondary | ICD-10-CM | POA: Diagnosis not present

## 2020-05-26 DIAGNOSIS — J918 Pleural effusion in other conditions classified elsewhere: Secondary | ICD-10-CM | POA: Diagnosis not present

## 2020-05-26 DIAGNOSIS — Z283 Underimmunization status: Secondary | ICD-10-CM | POA: Diagnosis not present

## 2020-05-26 DIAGNOSIS — E119 Type 2 diabetes mellitus without complications: Secondary | ICD-10-CM | POA: Diagnosis not present

## 2020-05-26 DIAGNOSIS — Z856 Personal history of leukemia: Secondary | ICD-10-CM | POA: Diagnosis not present

## 2020-05-26 DIAGNOSIS — R0602 Shortness of breath: Secondary | ICD-10-CM | POA: Diagnosis not present

## 2020-05-26 DIAGNOSIS — R1084 Generalized abdominal pain: Secondary | ICD-10-CM | POA: Diagnosis not present

## 2020-05-26 DIAGNOSIS — C9110 Chronic lymphocytic leukemia of B-cell type not having achieved remission: Secondary | ICD-10-CM | POA: Diagnosis not present

## 2020-05-26 DIAGNOSIS — J90 Pleural effusion, not elsewhere classified: Secondary | ICD-10-CM | POA: Diagnosis not present

## 2020-05-26 DIAGNOSIS — Z66 Do not resuscitate: Secondary | ICD-10-CM | POA: Diagnosis not present

## 2020-05-26 DIAGNOSIS — U071 COVID-19: Secondary | ICD-10-CM | POA: Diagnosis not present

## 2020-05-26 DIAGNOSIS — J9601 Acute respiratory failure with hypoxia: Secondary | ICD-10-CM | POA: Diagnosis not present

## 2020-05-26 DIAGNOSIS — R0781 Pleurodynia: Secondary | ICD-10-CM | POA: Diagnosis not present

## 2020-05-27 DIAGNOSIS — Z856 Personal history of leukemia: Secondary | ICD-10-CM | POA: Diagnosis not present

## 2020-05-27 DIAGNOSIS — U071 COVID-19: Secondary | ICD-10-CM | POA: Diagnosis not present

## 2020-05-27 DIAGNOSIS — E119 Type 2 diabetes mellitus without complications: Secondary | ICD-10-CM | POA: Diagnosis not present

## 2020-05-27 DIAGNOSIS — J9601 Acute respiratory failure with hypoxia: Secondary | ICD-10-CM | POA: Diagnosis not present

## 2020-05-27 DIAGNOSIS — R0781 Pleurodynia: Secondary | ICD-10-CM | POA: Diagnosis not present

## 2020-05-28 DIAGNOSIS — E119 Type 2 diabetes mellitus without complications: Secondary | ICD-10-CM | POA: Diagnosis not present

## 2020-05-28 DIAGNOSIS — U071 COVID-19: Secondary | ICD-10-CM | POA: Diagnosis not present

## 2020-05-28 DIAGNOSIS — J918 Pleural effusion in other conditions classified elsewhere: Secondary | ICD-10-CM | POA: Diagnosis not present

## 2020-05-28 DIAGNOSIS — J9601 Acute respiratory failure with hypoxia: Secondary | ICD-10-CM | POA: Diagnosis not present

## 2020-05-29 DIAGNOSIS — I700 Atherosclerosis of aorta: Secondary | ICD-10-CM | POA: Diagnosis not present

## 2020-05-29 DIAGNOSIS — U071 COVID-19: Secondary | ICD-10-CM | POA: Diagnosis not present

## 2020-05-29 DIAGNOSIS — J9811 Atelectasis: Secondary | ICD-10-CM | POA: Diagnosis not present

## 2020-05-29 DIAGNOSIS — J90 Pleural effusion, not elsewhere classified: Secondary | ICD-10-CM | POA: Diagnosis not present

## 2020-05-29 DIAGNOSIS — R161 Splenomegaly, not elsewhere classified: Secondary | ICD-10-CM | POA: Diagnosis not present

## 2020-05-29 DIAGNOSIS — R059 Cough, unspecified: Secondary | ICD-10-CM | POA: Diagnosis not present

## 2020-05-29 DIAGNOSIS — J189 Pneumonia, unspecified organism: Secondary | ICD-10-CM | POA: Diagnosis not present

## 2020-05-29 DIAGNOSIS — K449 Diaphragmatic hernia without obstruction or gangrene: Secondary | ICD-10-CM | POA: Diagnosis not present

## 2020-05-29 DIAGNOSIS — J1282 Pneumonia due to coronavirus disease 2019: Secondary | ICD-10-CM | POA: Diagnosis not present

## 2020-05-29 DIAGNOSIS — R590 Localized enlarged lymph nodes: Secondary | ICD-10-CM | POA: Diagnosis not present

## 2020-05-29 DIAGNOSIS — R0602 Shortness of breath: Secondary | ICD-10-CM | POA: Diagnosis not present

## 2020-06-02 DIAGNOSIS — H6690 Otitis media, unspecified, unspecified ear: Secondary | ICD-10-CM | POA: Diagnosis not present

## 2020-06-13 DIAGNOSIS — R319 Hematuria, unspecified: Secondary | ICD-10-CM | POA: Diagnosis not present

## 2020-06-13 DIAGNOSIS — N3941 Urge incontinence: Secondary | ICD-10-CM | POA: Diagnosis not present

## 2020-06-18 DIAGNOSIS — Z8719 Personal history of other diseases of the digestive system: Secondary | ICD-10-CM | POA: Diagnosis not present

## 2020-06-18 DIAGNOSIS — R846 Abnormal cytological findings in specimens from respiratory organs and thorax: Secondary | ICD-10-CM | POA: Diagnosis not present

## 2020-06-18 DIAGNOSIS — Z7984 Long term (current) use of oral hypoglycemic drugs: Secondary | ICD-10-CM | POA: Diagnosis not present

## 2020-06-18 DIAGNOSIS — R0682 Tachypnea, not elsewhere classified: Secondary | ICD-10-CM | POA: Diagnosis not present

## 2020-06-18 DIAGNOSIS — Z881 Allergy status to other antibiotic agents status: Secondary | ICD-10-CM | POA: Diagnosis not present

## 2020-06-18 DIAGNOSIS — D709 Neutropenia, unspecified: Secondary | ICD-10-CM | POA: Diagnosis not present

## 2020-06-18 DIAGNOSIS — E876 Hypokalemia: Secondary | ICD-10-CM | POA: Diagnosis not present

## 2020-06-18 DIAGNOSIS — Z79899 Other long term (current) drug therapy: Secondary | ICD-10-CM | POA: Diagnosis not present

## 2020-06-18 DIAGNOSIS — I2699 Other pulmonary embolism without acute cor pulmonale: Secondary | ICD-10-CM | POA: Diagnosis not present

## 2020-06-18 DIAGNOSIS — E119 Type 2 diabetes mellitus without complications: Secondary | ICD-10-CM | POA: Diagnosis not present

## 2020-06-18 DIAGNOSIS — Z9114 Patient's other noncompliance with medication regimen: Secondary | ICD-10-CM | POA: Diagnosis not present

## 2020-06-18 DIAGNOSIS — C9110 Chronic lymphocytic leukemia of B-cell type not having achieved remission: Secondary | ICD-10-CM | POA: Diagnosis not present

## 2020-06-18 DIAGNOSIS — Z792 Long term (current) use of antibiotics: Secondary | ICD-10-CM | POA: Diagnosis not present

## 2020-06-18 DIAGNOSIS — E785 Hyperlipidemia, unspecified: Secondary | ICD-10-CM | POA: Diagnosis not present

## 2020-06-18 DIAGNOSIS — Z8673 Personal history of transient ischemic attack (TIA), and cerebral infarction without residual deficits: Secondary | ICD-10-CM | POA: Diagnosis not present

## 2020-06-18 DIAGNOSIS — K219 Gastro-esophageal reflux disease without esophagitis: Secondary | ICD-10-CM | POA: Diagnosis not present

## 2020-06-18 DIAGNOSIS — Z856 Personal history of leukemia: Secondary | ICD-10-CM | POA: Diagnosis not present

## 2020-06-18 DIAGNOSIS — I340 Nonrheumatic mitral (valve) insufficiency: Secondary | ICD-10-CM | POA: Diagnosis not present

## 2020-06-18 DIAGNOSIS — Z888 Allergy status to other drugs, medicaments and biological substances status: Secondary | ICD-10-CM | POA: Diagnosis not present

## 2020-06-18 DIAGNOSIS — J45909 Unspecified asthma, uncomplicated: Secondary | ICD-10-CM | POA: Diagnosis not present

## 2020-06-18 DIAGNOSIS — R0602 Shortness of breath: Secondary | ICD-10-CM | POA: Diagnosis not present

## 2020-06-18 DIAGNOSIS — M810 Age-related osteoporosis without current pathological fracture: Secondary | ICD-10-CM | POA: Diagnosis not present

## 2020-06-18 DIAGNOSIS — J90 Pleural effusion, not elsewhere classified: Secondary | ICD-10-CM | POA: Diagnosis not present

## 2020-06-18 DIAGNOSIS — J9811 Atelectasis: Secondary | ICD-10-CM | POA: Diagnosis not present

## 2020-06-18 DIAGNOSIS — R161 Splenomegaly, not elsewhere classified: Secondary | ICD-10-CM | POA: Diagnosis not present

## 2020-06-18 DIAGNOSIS — K7460 Unspecified cirrhosis of liver: Secondary | ICD-10-CM | POA: Diagnosis not present

## 2020-06-18 DIAGNOSIS — I361 Nonrheumatic tricuspid (valve) insufficiency: Secondary | ICD-10-CM | POA: Diagnosis not present

## 2020-06-18 DIAGNOSIS — Z8616 Personal history of COVID-19: Secondary | ICD-10-CM | POA: Diagnosis not present

## 2020-06-22 DIAGNOSIS — R053 Chronic cough: Secondary | ICD-10-CM | POA: Diagnosis not present

## 2020-06-22 DIAGNOSIS — J90 Pleural effusion, not elsewhere classified: Secondary | ICD-10-CM | POA: Diagnosis not present

## 2020-06-22 DIAGNOSIS — H9193 Unspecified hearing loss, bilateral: Secondary | ICD-10-CM | POA: Diagnosis not present

## 2020-06-22 DIAGNOSIS — U099 Post covid-19 condition, unspecified: Secondary | ICD-10-CM | POA: Diagnosis not present

## 2020-06-23 DIAGNOSIS — Z1152 Encounter for screening for COVID-19: Secondary | ICD-10-CM | POA: Diagnosis not present

## 2020-06-23 DIAGNOSIS — Z20828 Contact with and (suspected) exposure to other viral communicable diseases: Secondary | ICD-10-CM | POA: Diagnosis not present

## 2020-07-07 DIAGNOSIS — R0602 Shortness of breath: Secondary | ICD-10-CM | POA: Diagnosis not present

## 2020-07-07 DIAGNOSIS — C9110 Chronic lymphocytic leukemia of B-cell type not having achieved remission: Secondary | ICD-10-CM | POA: Diagnosis not present

## 2020-07-07 DIAGNOSIS — J90 Pleural effusion, not elsewhere classified: Secondary | ICD-10-CM | POA: Diagnosis not present

## 2020-07-07 DIAGNOSIS — R0981 Nasal congestion: Secondary | ICD-10-CM | POA: Diagnosis not present

## 2020-07-07 DIAGNOSIS — I700 Atherosclerosis of aorta: Secondary | ICD-10-CM | POA: Diagnosis not present

## 2020-07-07 DIAGNOSIS — J449 Chronic obstructive pulmonary disease, unspecified: Secondary | ICD-10-CM | POA: Diagnosis not present

## 2020-07-17 DIAGNOSIS — R519 Headache, unspecified: Secondary | ICD-10-CM | POA: Diagnosis not present

## 2020-07-17 DIAGNOSIS — B0052 Herpesviral keratitis: Secondary | ICD-10-CM | POA: Diagnosis not present

## 2020-07-17 DIAGNOSIS — H179 Unspecified corneal scar and opacity: Secondary | ICD-10-CM | POA: Diagnosis not present

## 2020-07-17 DIAGNOSIS — H25813 Combined forms of age-related cataract, bilateral: Secondary | ICD-10-CM | POA: Diagnosis not present

## 2020-07-19 DIAGNOSIS — B9689 Other specified bacterial agents as the cause of diseases classified elsewhere: Secondary | ICD-10-CM | POA: Diagnosis not present

## 2020-07-19 DIAGNOSIS — J019 Acute sinusitis, unspecified: Secondary | ICD-10-CM | POA: Diagnosis not present

## 2020-07-27 DIAGNOSIS — J90 Pleural effusion, not elsewhere classified: Secondary | ICD-10-CM | POA: Diagnosis not present

## 2020-07-31 DIAGNOSIS — H25813 Combined forms of age-related cataract, bilateral: Secondary | ICD-10-CM | POA: Diagnosis not present

## 2020-07-31 DIAGNOSIS — Z79899 Other long term (current) drug therapy: Secondary | ICD-10-CM | POA: Diagnosis not present

## 2020-07-31 DIAGNOSIS — R519 Headache, unspecified: Secondary | ICD-10-CM | POA: Diagnosis not present

## 2020-07-31 DIAGNOSIS — B0052 Herpesviral keratitis: Secondary | ICD-10-CM | POA: Diagnosis not present

## 2020-07-31 DIAGNOSIS — H16231 Neurotrophic keratoconjunctivitis, right eye: Secondary | ICD-10-CM | POA: Diagnosis not present

## 2020-07-31 DIAGNOSIS — H179 Unspecified corneal scar and opacity: Secondary | ICD-10-CM | POA: Diagnosis not present

## 2020-08-21 DIAGNOSIS — I771 Stricture of artery: Secondary | ICD-10-CM | POA: Diagnosis not present

## 2020-08-21 DIAGNOSIS — H25813 Combined forms of age-related cataract, bilateral: Secondary | ICD-10-CM | POA: Diagnosis not present

## 2020-08-21 DIAGNOSIS — I77811 Abdominal aortic ectasia: Secondary | ICD-10-CM | POA: Diagnosis not present

## 2020-08-21 DIAGNOSIS — C8307 Small cell B-cell lymphoma, spleen: Secondary | ICD-10-CM | POA: Diagnosis not present

## 2020-08-21 DIAGNOSIS — I723 Aneurysm of iliac artery: Secondary | ICD-10-CM | POA: Diagnosis not present

## 2020-08-21 DIAGNOSIS — K551 Chronic vascular disorders of intestine: Secondary | ICD-10-CM | POA: Diagnosis not present

## 2020-08-21 DIAGNOSIS — B0052 Herpesviral keratitis: Secondary | ICD-10-CM | POA: Diagnosis not present

## 2020-08-21 DIAGNOSIS — H179 Unspecified corneal scar and opacity: Secondary | ICD-10-CM | POA: Diagnosis not present

## 2020-08-21 DIAGNOSIS — H16231 Neurotrophic keratoconjunctivitis, right eye: Secondary | ICD-10-CM | POA: Diagnosis not present

## 2020-08-28 DIAGNOSIS — H6591 Unspecified nonsuppurative otitis media, right ear: Secondary | ICD-10-CM | POA: Diagnosis not present

## 2020-08-28 DIAGNOSIS — R490 Dysphonia: Secondary | ICD-10-CM | POA: Diagnosis not present

## 2020-08-28 DIAGNOSIS — H65191 Other acute nonsuppurative otitis media, right ear: Secondary | ICD-10-CM | POA: Diagnosis not present

## 2020-09-08 DIAGNOSIS — Z20828 Contact with and (suspected) exposure to other viral communicable diseases: Secondary | ICD-10-CM | POA: Diagnosis not present

## 2020-09-08 DIAGNOSIS — R509 Fever, unspecified: Secondary | ICD-10-CM | POA: Diagnosis not present

## 2020-09-08 DIAGNOSIS — J069 Acute upper respiratory infection, unspecified: Secondary | ICD-10-CM | POA: Diagnosis not present

## 2020-09-21 DIAGNOSIS — J069 Acute upper respiratory infection, unspecified: Secondary | ICD-10-CM | POA: Diagnosis not present

## 2020-09-25 DIAGNOSIS — H16231 Neurotrophic keratoconjunctivitis, right eye: Secondary | ICD-10-CM | POA: Diagnosis not present

## 2020-09-25 DIAGNOSIS — H179 Unspecified corneal scar and opacity: Secondary | ICD-10-CM | POA: Diagnosis not present

## 2020-09-25 DIAGNOSIS — H25813 Combined forms of age-related cataract, bilateral: Secondary | ICD-10-CM | POA: Diagnosis not present

## 2020-09-25 DIAGNOSIS — B0052 Herpesviral keratitis: Secondary | ICD-10-CM | POA: Diagnosis not present

## 2020-11-06 DIAGNOSIS — R051 Acute cough: Secondary | ICD-10-CM | POA: Diagnosis not present

## 2020-11-06 DIAGNOSIS — J918 Pleural effusion in other conditions classified elsewhere: Secondary | ICD-10-CM | POA: Diagnosis not present

## 2020-11-06 DIAGNOSIS — R091 Pleurisy: Secondary | ICD-10-CM | POA: Diagnosis not present

## 2020-11-06 DIAGNOSIS — R0981 Nasal congestion: Secondary | ICD-10-CM | POA: Diagnosis not present

## 2020-11-06 DIAGNOSIS — J209 Acute bronchitis, unspecified: Secondary | ICD-10-CM | POA: Diagnosis not present

## 2020-11-06 DIAGNOSIS — Z20828 Contact with and (suspected) exposure to other viral communicable diseases: Secondary | ICD-10-CM | POA: Diagnosis not present

## 2020-11-07 DIAGNOSIS — J208 Acute bronchitis due to other specified organisms: Secondary | ICD-10-CM | POA: Diagnosis not present

## 2020-11-07 DIAGNOSIS — B9689 Other specified bacterial agents as the cause of diseases classified elsewhere: Secondary | ICD-10-CM | POA: Diagnosis not present

## 2020-11-07 DIAGNOSIS — J90 Pleural effusion, not elsewhere classified: Secondary | ICD-10-CM | POA: Diagnosis not present

## 2020-11-07 DIAGNOSIS — R0609 Other forms of dyspnea: Secondary | ICD-10-CM | POA: Diagnosis not present

## 2020-12-11 DIAGNOSIS — H179 Unspecified corneal scar and opacity: Secondary | ICD-10-CM | POA: Diagnosis not present

## 2020-12-11 DIAGNOSIS — B0052 Herpesviral keratitis: Secondary | ICD-10-CM | POA: Diagnosis not present

## 2020-12-11 DIAGNOSIS — H25813 Combined forms of age-related cataract, bilateral: Secondary | ICD-10-CM | POA: Diagnosis not present

## 2020-12-12 DIAGNOSIS — R7301 Impaired fasting glucose: Secondary | ICD-10-CM | POA: Diagnosis not present

## 2020-12-12 DIAGNOSIS — I700 Atherosclerosis of aorta: Secondary | ICD-10-CM | POA: Diagnosis not present

## 2020-12-12 DIAGNOSIS — K7689 Other specified diseases of liver: Secondary | ICD-10-CM | POA: Diagnosis not present

## 2020-12-12 DIAGNOSIS — C884 Extranodal marginal zone B-cell lymphoma of mucosa-associated lymphoid tissue [MALT-lymphoma]: Secondary | ICD-10-CM | POA: Diagnosis not present

## 2020-12-12 DIAGNOSIS — J90 Pleural effusion, not elsewhere classified: Secondary | ICD-10-CM | POA: Diagnosis not present

## 2020-12-12 DIAGNOSIS — C8307 Small cell B-cell lymphoma, spleen: Secondary | ICD-10-CM | POA: Diagnosis not present

## 2021-01-02 DIAGNOSIS — D6959 Other secondary thrombocytopenia: Secondary | ICD-10-CM | POA: Diagnosis not present

## 2021-01-02 DIAGNOSIS — C8587 Other specified types of non-Hodgkin lymphoma, spleen: Secondary | ICD-10-CM | POA: Diagnosis not present

## 2021-01-02 DIAGNOSIS — C8307 Small cell B-cell lymphoma, spleen: Secondary | ICD-10-CM | POA: Diagnosis not present

## 2021-01-02 DIAGNOSIS — D709 Neutropenia, unspecified: Secondary | ICD-10-CM | POA: Diagnosis not present

## 2021-01-02 DIAGNOSIS — R161 Splenomegaly, not elsewhere classified: Secondary | ICD-10-CM | POA: Diagnosis not present

## 2021-01-02 DIAGNOSIS — Z856 Personal history of leukemia: Secondary | ICD-10-CM | POA: Diagnosis not present

## 2021-01-02 DIAGNOSIS — J90 Pleural effusion, not elsewhere classified: Secondary | ICD-10-CM | POA: Diagnosis not present

## 2021-02-15 DIAGNOSIS — Z1331 Encounter for screening for depression: Secondary | ICD-10-CM | POA: Diagnosis not present

## 2021-02-15 DIAGNOSIS — Z0000 Encounter for general adult medical examination without abnormal findings: Secondary | ICD-10-CM | POA: Diagnosis not present

## 2021-02-15 DIAGNOSIS — Z9181 History of falling: Secondary | ICD-10-CM | POA: Diagnosis not present

## 2021-02-15 DIAGNOSIS — E785 Hyperlipidemia, unspecified: Secondary | ICD-10-CM | POA: Diagnosis not present

## 2021-03-12 DIAGNOSIS — H179 Unspecified corneal scar and opacity: Secondary | ICD-10-CM | POA: Diagnosis not present

## 2021-03-12 DIAGNOSIS — H25813 Combined forms of age-related cataract, bilateral: Secondary | ICD-10-CM | POA: Diagnosis not present

## 2021-03-12 DIAGNOSIS — H16231 Neurotrophic keratoconjunctivitis, right eye: Secondary | ICD-10-CM | POA: Diagnosis not present

## 2021-03-12 DIAGNOSIS — B0052 Herpesviral keratitis: Secondary | ICD-10-CM | POA: Diagnosis not present

## 2021-03-18 DIAGNOSIS — J0100 Acute maxillary sinusitis, unspecified: Secondary | ICD-10-CM | POA: Diagnosis not present

## 2021-03-26 DIAGNOSIS — J019 Acute sinusitis, unspecified: Secondary | ICD-10-CM | POA: Diagnosis not present

## 2021-03-26 DIAGNOSIS — B9689 Other specified bacterial agents as the cause of diseases classified elsewhere: Secondary | ICD-10-CM | POA: Diagnosis not present

## 2021-03-26 DIAGNOSIS — J208 Acute bronchitis due to other specified organisms: Secondary | ICD-10-CM | POA: Diagnosis not present

## 2021-07-21 DEATH — deceased
